# Patient Record
Sex: Female | Born: 1975 | Race: Black or African American | Hispanic: No | Marital: Married | Smoking: Never smoker
Health system: Southern US, Community
[De-identification: ages and names within clinical notes are randomized; demographics above are authoritative.]

## PROBLEM LIST (undated history)

## (undated) ENCOUNTER — Inpatient Hospital Stay (HOSPITAL_COMMUNITY): Payer: Self-pay

## (undated) DIAGNOSIS — E039 Hypothyroidism, unspecified: Secondary | ICD-10-CM

## (undated) DIAGNOSIS — E221 Hyperprolactinemia: Secondary | ICD-10-CM

## (undated) DIAGNOSIS — D649 Anemia, unspecified: Secondary | ICD-10-CM

## (undated) HISTORY — PX: DILATION AND CURETTAGE OF UTERUS: SHX78

## (undated) HISTORY — PX: OTHER SURGICAL HISTORY: SHX169

---

## 2013-07-02 ENCOUNTER — Other Ambulatory Visit: Payer: Self-pay | Admitting: Internal Medicine

## 2013-07-02 DIAGNOSIS — N926 Irregular menstruation, unspecified: Secondary | ICD-10-CM

## 2013-07-03 ENCOUNTER — Ambulatory Visit
Admission: RE | Admit: 2013-07-03 | Discharge: 2013-07-03 | Disposition: A | Discharge status: Home / Self Care | Payer: Managed Care, Other (non HMO) | Source: Ambulatory Visit | Attending: Internal Medicine | Admitting: Internal Medicine

## 2013-07-03 DIAGNOSIS — N926 Irregular menstruation, unspecified: Secondary | ICD-10-CM

## 2013-08-18 ENCOUNTER — Other Ambulatory Visit: Payer: Self-pay | Admitting: Internal Medicine

## 2013-08-18 DIAGNOSIS — E229 Hyperfunction of pituitary gland, unspecified: Principal | ICD-10-CM

## 2013-08-18 DIAGNOSIS — R7989 Other specified abnormal findings of blood chemistry: Secondary | ICD-10-CM

## 2013-08-29 ENCOUNTER — Ambulatory Visit (HOSPITAL_COMMUNITY)
Admission: RE | Admit: 2013-08-29 | Discharge: 2013-08-29 | Disposition: A | Discharge status: Home / Self Care | Payer: Managed Care, Other (non HMO) | Source: Ambulatory Visit | Attending: Internal Medicine | Admitting: Internal Medicine

## 2013-08-29 DIAGNOSIS — R7989 Other specified abnormal findings of blood chemistry: Secondary | ICD-10-CM

## 2013-08-29 DIAGNOSIS — D352 Benign neoplasm of pituitary gland: Secondary | ICD-10-CM | POA: Insufficient documentation

## 2013-08-29 DIAGNOSIS — E229 Hyperfunction of pituitary gland, unspecified: Secondary | ICD-10-CM | POA: Insufficient documentation

## 2013-08-29 DIAGNOSIS — D353 Benign neoplasm of craniopharyngeal duct: Secondary | ICD-10-CM

## 2013-08-29 MED ORDER — GADOBENATE DIMEGLUMINE 529 MG/ML IV SOLN
10.0000 mL | Freq: Once | INTRAVENOUS | Status: AC | PRN
  Administered 2013-08-29: 10 mL via INTRAVENOUS

## 2013-09-18 ENCOUNTER — Other Ambulatory Visit: Payer: Self-pay | Admitting: Obstetrics and Gynecology

## 2013-09-19 ENCOUNTER — Encounter (HOSPITAL_COMMUNITY): Payer: Self-pay | Admitting: Pharmacist

## 2013-09-23 ENCOUNTER — Other Ambulatory Visit (HOSPITAL_COMMUNITY): Payer: Self-pay | Admitting: Obstetrics and Gynecology

## 2013-09-23 NOTE — H&P (Signed)
Meghan Parsons is a 38 y.o. female G 0 for hysteroscopy, dilatation and curettage because of endometrial masses, menorrhagia, anemia and dysmenorrhea.  The patient was referred by Dr. 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  The ultrasound showed a retroverted uterus: 5.9 x 5.8  x 5.7 cm, endometrium measuring 16.6 mm with multiple masses-questionable polyps,  measuring: 10 mm, 22 mm and 13 mm;  multiple fibroids measuring: 11 mm,  13 mm and 3.9 cm; both ovaries and the left tube were normal though the right tube reflected a hydrosalpinx or loculated peritoneal fluid. The patient went on to report monthly menses that will last for 5 days during which time she passes large clots and changes her protection 3 times  a day.  With the passage of clots she will have severe cramping but does not take anything for it.  An endometrial biopsy performed in May showed fragments of benign polyps, no malignancy or atypia.  Lab results at that same time showed, normal TSH, CBC (except H/H = 10.2/32.6, and  elevated prolactin.  Patient had a CT of the brain that showed a 9 mm microadenoma for which she is being treated medically by an endocrinologist. She denies any urinary tract or bowel symptoms, vaginitis symptoms, intermenstrual bleeding or dyspareunia.  After reviewing the finding on ultrasound, endometrial biopsy and menstrual history the patient wanted to proceed with surgical management.   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 ferrous sulfate 325 (65 FE) MG tablet Take 325 mg by mouth at bedtime.  Cabegoline  0.5 mg daily  No Known Allergies  ROS:  Denies corrective lenses,  headache, vision changes, nasal congestion, dysphagia, tinnitus, dizziness, hoarseness, cough,  chest pain, shortness of breath, nausea, vomiting, diarrhea,constipation,  urinary frequency, urgency  dysuria, hematuria, vaginitis symptoms, pelvic pain, swelling of joints,easy bruising,  myalgias, arthralgias, skin rashes, unexplained weight loss and except as is mentioned in the history of present illness, patient's review of systems is otherwise negative.   Physical Exam  Bp: 104/60   P: 72  R: 16  Temperature:  98.6 degrees F orally   Weight: 238 lbs  Height: 5'8"   BMI: 36.2  Neck: supple without masses or thyromegaly Lungs: clear to auscultation Heart: regular rate and rhythm Abdomen: soft, non-tender and no organomegaly Pelvic:EGBUS- wnl; vagina-small blood uterus-normal size but irregular; cervix without tenderness or masses; adnexae without tenderness or masses Extremities:  no clubbing, cyanosis or edema   Assesment: Endometrial Polyps           Menorrhagia           Anemia           Dysmenorrhea              Disposition:  A discussion was held with patient regarding the indication for her procedure(s) along with the risks, which include but are not limited to: reaction to anesthesia, damage to adjacent organs, infection and excessive bleeding.  The patient verbalized understanding of these risks and has consented to proceed with Hysteroscopy, Dilatation and Curettage at [place] on September 24, 2013 at 2:30 p.m.   CSN# [id number]   Rebeckah Masih J. Florene Glen, PA-C  for Dr. Harvie Bridge. Mancel Bale

## 2013-09-24 ENCOUNTER — Ambulatory Visit (HOSPITAL_COMMUNITY)
Admission: RE | Admit: 2013-09-24 | Discharge: 2013-09-24 | Disposition: A | Discharge status: Home / Self Care | Payer: Managed Care, Other (non HMO) | Source: Ambulatory Visit | Attending: Obstetrics and Gynecology | Admitting: Obstetrics and Gynecology

## 2013-09-24 ENCOUNTER — Encounter (HOSPITAL_COMMUNITY)
Admission: RE | Disposition: A | Discharge status: Home / Self Care | Payer: Self-pay | Source: Ambulatory Visit | Attending: Obstetrics and Gynecology

## 2013-09-24 ENCOUNTER — Encounter (HOSPITAL_COMMUNITY): Payer: Managed Care, Other (non HMO) | Admitting: Anesthesiology

## 2013-09-24 ENCOUNTER — Ambulatory Visit (HOSPITAL_COMMUNITY): Payer: Managed Care, Other (non HMO) | Admitting: Anesthesiology

## 2013-09-24 ENCOUNTER — Encounter (HOSPITAL_COMMUNITY): Payer: Self-pay | Admitting: Anesthesiology

## 2013-09-24 DIAGNOSIS — N840 Polyp of corpus uteri: Secondary | ICD-10-CM | POA: Insufficient documentation

## 2013-09-24 DIAGNOSIS — D649 Anemia, unspecified: Secondary | ICD-10-CM | POA: Insufficient documentation

## 2013-09-24 DIAGNOSIS — N7013 Chronic salpingitis and oophoritis: Secondary | ICD-10-CM | POA: Insufficient documentation

## 2013-09-24 DIAGNOSIS — N920 Excessive and frequent menstruation with regular cycle: Secondary | ICD-10-CM | POA: Insufficient documentation

## 2013-09-24 DIAGNOSIS — N946 Dysmenorrhea, unspecified: Secondary | ICD-10-CM | POA: Insufficient documentation

## 2013-09-24 HISTORY — PX: DILATATION & CURRETTAGE/HYSTEROSCOPY WITH RESECTOCOPE: SHX5572

## 2013-09-24 LAB — CBC
HCT: 33.8 % — ABNORMAL LOW (ref 36.0–46.0)
Hemoglobin: 10.7 g/dL — ABNORMAL LOW (ref 12.0–15.0)
MCH: 23 pg — ABNORMAL LOW (ref 26.0–34.0)
MCHC: 31.7 g/dL (ref 30.0–36.0)
MCV: 72.7 fL — AB (ref 78.0–100.0)
PLATELETS: 249 10*3/uL (ref 150–400)
RBC: 4.65 MIL/uL (ref 3.87–5.11)
RDW: 14.4 % (ref 11.5–15.5)
WBC: 4.7 10*3/uL (ref 4.0–10.5)

## 2013-09-24 LAB — PREGNANCY, URINE: Preg Test, Ur: NEGATIVE

## 2013-09-24 SURGERY — DILATATION & CURETTAGE/HYSTEROSCOPY WITH RESECTOCOPE
Anesthesia: General | Site: Vagina

## 2013-09-24 MED ORDER — LIDOCAINE HCL (CARDIAC) 20 MG/ML IV SOLN
INTRAVENOUS | Status: DC | PRN
  Administered 2013-09-24: 100 mg via INTRAVENOUS

## 2013-09-24 MED ORDER — FENTANYL CITRATE 0.05 MG/ML IJ SOLN
25.0000 ug | INTRAMUSCULAR | Status: DC | PRN
  Administered 2013-09-24: 50 ug via INTRAVENOUS

## 2013-09-24 MED ORDER — LACTATED RINGERS IV SOLN
INTRAVENOUS | Status: DC
Start: 2013-09-24 — End: 2013-09-24
  Administered 2013-09-24 (×2): via INTRAVENOUS

## 2013-09-24 MED ORDER — GLYCINE 1.5 % IR SOLN
Status: DC | PRN
  Administered 2013-09-24: 3000 mL

## 2013-09-24 MED ORDER — LIDOCAINE HCL 1 % IJ SOLN
INTRAMUSCULAR | Status: DC | PRN
Start: 2013-09-24 — End: 2013-09-24
  Administered 2013-09-24: 10 mL

## 2013-09-24 MED ORDER — DEXAMETHASONE SODIUM PHOSPHATE 10 MG/ML IJ SOLN
INTRAMUSCULAR | Status: DC | PRN
  Administered 2013-09-24: 10 mg via INTRAVENOUS

## 2013-09-24 MED ORDER — KETOROLAC TROMETHAMINE 30 MG/ML IJ SOLN
INTRAMUSCULAR | Status: AC
  Filled 2013-09-24: qty 1

## 2013-09-24 MED ORDER — FENTANYL CITRATE 0.05 MG/ML IJ SOLN
INTRAMUSCULAR | Status: AC
  Filled 2013-09-24: qty 2

## 2013-09-24 MED ORDER — KETOROLAC TROMETHAMINE 30 MG/ML IJ SOLN
INTRAMUSCULAR | Status: DC | PRN
  Administered 2013-09-24: 30 mg via INTRAVENOUS

## 2013-09-24 MED ORDER — LIDOCAINE HCL 1 % IJ SOLN
INTRAMUSCULAR | Status: AC
  Filled 2013-09-24: qty 20

## 2013-09-24 MED ORDER — ONDANSETRON HCL 4 MG/2ML IJ SOLN
INTRAMUSCULAR | Status: AC
  Filled 2013-09-24: qty 2

## 2013-09-24 MED ORDER — MIDAZOLAM HCL 2 MG/2ML IJ SOLN
INTRAMUSCULAR | Status: DC | PRN
  Administered 2013-09-24: 2 mg via INTRAVENOUS

## 2013-09-24 MED ORDER — OXYCODONE-ACETAMINOPHEN 5-325 MG PO TABS: 1.0000 | ORAL_TABLET | Freq: Four times a day (QID) | ORAL | Status: DC | PRN

## 2013-09-24 MED ORDER — PROPOFOL 10 MG/ML IV EMUL
INTRAVENOUS | Status: AC
  Filled 2013-09-24: qty 20

## 2013-09-24 MED ORDER — MEPERIDINE HCL 25 MG/ML IJ SOLN: 6.2500 mg | INTRAMUSCULAR | Status: DC | PRN

## 2013-09-24 MED ORDER — FENTANYL CITRATE 0.05 MG/ML IJ SOLN
INTRAMUSCULAR | Status: DC | PRN
Start: 2013-09-24 — End: 2013-09-24
  Administered 2013-09-24: 100 ug via INTRAVENOUS

## 2013-09-24 MED ORDER — METOCLOPRAMIDE HCL 5 MG/ML IJ SOLN: 10.0000 mg | Freq: Once | INTRAMUSCULAR | Status: DC | PRN

## 2013-09-24 MED ORDER — FENTANYL CITRATE 0.05 MG/ML IJ SOLN
INTRAMUSCULAR | Status: AC
  Administered 2013-09-24: 50 ug via INTRAVENOUS
  Filled 2013-09-24: qty 2

## 2013-09-24 MED ORDER — ONDANSETRON HCL 4 MG/2ML IJ SOLN
INTRAMUSCULAR | Status: DC | PRN
  Administered 2013-09-24: 4 mg via INTRAVENOUS

## 2013-09-24 MED ORDER — IBUPROFEN 600 MG PO TABS: 600.0000 mg | ORAL_TABLET | Freq: Four times a day (QID) | ORAL | Status: DC | PRN

## 2013-09-24 MED ORDER — MIDAZOLAM HCL 2 MG/2ML IJ SOLN
INTRAMUSCULAR | Status: AC
  Filled 2013-09-24: qty 2

## 2013-09-24 MED ORDER — PROPOFOL 10 MG/ML IV BOLUS
INTRAVENOUS | Status: DC | PRN
  Administered 2013-09-24: 200 mg via INTRAVENOUS

## 2013-09-24 MED ORDER — DEXAMETHASONE SODIUM PHOSPHATE 10 MG/ML IJ SOLN
INTRAMUSCULAR | Status: AC
  Filled 2013-09-24: qty 1

## 2013-09-24 MED ORDER — LIDOCAINE HCL (CARDIAC) 20 MG/ML IV SOLN
INTRAVENOUS | Status: AC
  Filled 2013-09-24: qty 5

## 2013-09-24 SURGICAL SUPPLY — 19 items
CANISTER SUCT 3000ML (MISCELLANEOUS) ×3 IMPLANT
CATH ROBINSON RED A/P 16FR (CATHETERS) ×3 IMPLANT
CLOTH BEACON ORANGE TIMEOUT ST (SAFETY) ×3 IMPLANT
CONTAINER PREFILL 10% NBF 60ML (FORM) ×6 IMPLANT
DRAPE HYSTEROSCOPY (DRAPE) ×3 IMPLANT
DRSG TELFA 3X8 NADH (GAUZE/BANDAGES/DRESSINGS) ×3 IMPLANT
ELECT REM PT RETURN 9FT ADLT (ELECTROSURGICAL) ×3
ELECTRODE REM PT RTRN 9FT ADLT (ELECTROSURGICAL) ×1 IMPLANT
GLOVE BIO SURGEON STRL SZ7.5 (GLOVE) ×3 IMPLANT
GLOVE BIOGEL PI IND STRL 7.5 (GLOVE) ×1 IMPLANT
GLOVE BIOGEL PI INDICATOR 7.5 (GLOVE) ×2
GOWN STRL REUS W/TWL LRG LVL3 (GOWN DISPOSABLE) ×6 IMPLANT
LOOP ANGLED CUTTING 22FR (CUTTING LOOP) IMPLANT
PACK VAGINAL MINOR WOMEN LF (CUSTOM PROCEDURE TRAY) ×3 IMPLANT
PAD OB MATERNITY 4.3X12.25 (PERSONAL CARE ITEMS) ×3 IMPLANT
SET TUBING HYSTEROSCOPY 2 NDL (TUBING) IMPLANT
TOWEL OR 17X24 6PK STRL BLUE (TOWEL DISPOSABLE) ×6 IMPLANT
TUBE HYSTEROSCOPY W Y-CONNECT (TUBING) IMPLANT
WATER STERILE IRR 1000ML POUR (IV SOLUTION) ×3 IMPLANT

## 2013-09-24 NOTE — H&P (View-Only) (Signed)
Meghan Parsons is a 38 y.o. female G 0 for hysteroscopy, dilatation and curettage because of endometrial masses, menorrhagia, anemia and dysmenorrhea.  The patient was referred by Dr. 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  The ultrasound showed a retroverted uterus: 5.9 x 5.8  x 5.7 cm, endometrium measuring 16.6 mm with multiple masses-questionable polyps,  measuring: 10 mm, 22 mm and 13 mm;  multiple fibroids measuring: 11 mm,  13 mm and 3.9 cm; both ovaries and the left tube were normal though the right tube reflected a hydrosalpinx or loculated peritoneal fluid. The patient went on to report monthly menses that will last for 5 days during which time she passes large clots and changes her protection 3 times  a day.  With the passage of clots she will have severe cramping but does not take anything for it.  An endometrial biopsy performed in May showed fragments of benign polyps, no malignancy or atypia.  Lab results at that same time showed, normal TSH, CBC (except H/H = 10.2/32.6, and  elevated prolactin.  Patient had a CT of the brain that showed a 9 mm microadenoma for which she is being treated medically by an endocrinologist. She denies any urinary tract or bowel symptoms, vaginitis symptoms, intermenstrual bleeding or dyspareunia.  After reviewing the finding on ultrasound, endometrial biopsy and menstrual history the patient wanted to proceed with surgical management.   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 ferrous sulfate 325 (65 FE) MG tablet Take 325 mg by mouth at bedtime.  Cabegoline  0.5 mg daily  No Known Allergies  ROS:  Denies corrective lenses,  headache, vision changes, nasal congestion, dysphagia, tinnitus, dizziness, hoarseness, cough,  chest pain, shortness of breath, nausea, vomiting, diarrhea,constipation,  urinary frequency, urgency  dysuria, hematuria, vaginitis symptoms, pelvic pain, swelling of joints,easy bruising,  myalgias, arthralgias, skin rashes, unexplained weight loss and except as is mentioned in the history of present illness, patient's review of systems is otherwise negative.   Physical Exam  Bp: 104/60   P: 72  R: 16  Temperature:  98.6 degrees F orally   Weight: 238 lbs  Height: 5'8"   BMI: 36.2  Neck: supple without masses or thyromegaly Lungs: clear to auscultation Heart: regular rate and rhythm Abdomen: soft, non-tender and no organomegaly Pelvic:EGBUS- wnl; vagina-small blood uterus-normal size but irregular; cervix without tenderness or masses; adnexae without tenderness or masses Extremities:  no clubbing, cyanosis or edema   Assesment: Endometrial Polyps           Menorrhagia           Anemia           Dysmenorrhea              Disposition:  A discussion was held with patient regarding the indication for her procedure(s) along with the risks, which include but are not limited to: reaction to anesthesia, damage to adjacent organs, infection and excessive bleeding.  The patient verbalized understanding of these risks and has consented to proceed with Hysteroscopy, Dilatation and Curettage at [facility] on September 24, 2013 at 2:30 p.m.   CSN# [id number]   Kensington Duerst J. Florene Glen, PA-C  for Dr. Harvie Bridge. Mancel Bale

## 2013-09-24 NOTE — Discharge Instructions (Signed)

## 2013-09-24 NOTE — Op Note (Signed)
Preop Diagnosis: 1.Menorrhagia 2.Polyp  Postop Diagnosis: 1.Menorrhagia 2.Polyp  Procedure: DILATION & CURETTAGE/HYSTEROSCOPY   Anesthesia: Choice   Anesthesiologist: Lyn Hollingshead, MD   Attending: Delice Lesch, MD   Assistant: N/a  Findings: Polyp on posterior/left side wall of uterus  Pathology: Endometrial Curettings  Fluids: 1000 cc  UOP: 30 cc via straight cath  EBL: Minimal  Complications: None  Procedure: The patient was taken to the operating room after the risks, benefits and alternatives were discussed with the patient. The patient verbalized understanding and consent signed and witnessed. The patient was placed under general anesthesia with an LMA per anesthesiologist and prepped and draped in the normal sterile fashion.  Time Out was performed per protocol.  A bivalve speculum was placed in the patient's vagina and the anterior lip of the cervix was grasped with a single tooth tenaculum.  A paracervical block was administered using a total of 10 cc of 1% lidocaine. The uterus sounded to 9 cm. The cervix was dilated for passage of the hysteroscope.  The hysteroscope was introduced into the uterine cavity and findings as noted above. Sharp curettage was performed until a gritty texture was noted and curettings were sent to pathology.  A polyp forcep was placed through the port on the hysteroscope and remainder of polyp removed.  The hysteroscope was reintroduced and no obvious remaining intracavitary lesions were noted.  All instruments were removed. Sponge lap and needle count was correct. The patient tolerated the procedure well and was returned to the recovery room in good condition.

## 2013-09-24 NOTE — Anesthesia Postprocedure Evaluation (Signed)
Anesthesia Post Note  Patient: Meghan Parsons  Procedure(s) Performed: Procedure(s) (LRB): DILATATION & CURETTAGE/HYSTEROSCOPY WITH RESECTOCOPE (N/A)  Anesthesia type: General  Patient location: PACU  Post pain: Pain level controlled  Post assessment: Post-op Vital signs reviewed  Last Vitals:  Filed Vitals:   09/24/13 1615  BP: 121/73  Pulse: 72  Temp:   Resp: 16    Post vital signs: Reviewed  Level of consciousness: sedated  Complications: No apparent anesthesia complications

## 2013-09-24 NOTE — Anesthesia Preprocedure Evaluation (Signed)
Anesthesia Evaluation  Patient identified by MRN, date of birth, ID band Patient awake    Reviewed: Allergy & Precautions, H&P , NPO status , Patient's Chart, lab work & pertinent test results  Airway Mallampati: III TM Distance: >3 FB Neck ROM: Full    Dental no notable dental hx. (+) Teeth Intact   Pulmonary neg pulmonary ROS,  breath sounds clear to auscultation  Pulmonary exam normal       Cardiovascular negative cardio ROS  Rhythm:Regular Rate:Normal     Neuro/Psych negative neurological ROS  negative psych ROS   GI/Hepatic negative GI ROS, Neg liver ROS,   Endo/Other  negative endocrine ROS  Renal/GU negative Renal ROS  negative genitourinary   Musculoskeletal negative musculoskeletal ROS (+)   Abdominal (+) + obese,   Peds  Hematology negative hematology ROS (+)   Anesthesia Other Findings   Reproductive/Obstetrics Endometrial Polyp Menorrhagia dysmenorrhea                           Anesthesia Physical Anesthesia Plan  ASA: II  Anesthesia Plan: General   Post-op Pain Management:    Induction: Intravenous  Airway Management Planned: LMA  Additional Equipment:   Intra-op Plan:   Post-operative Plan: Extubation in OR  Informed Consent: I have reviewed the patients History and Physical, chart, labs and discussed the procedure including the risks, benefits and alternatives for the proposed anesthesia with the patient or authorized representative who has indicated his/her understanding and acceptance.   Dental advisory given  Plan Discussed with: CRNA, Anesthesiologist and Surgeon  Anesthesia Plan Comments:         Anesthesia Quick Evaluation

## 2013-09-24 NOTE — Interval H&P Note (Signed)
History and Physical Interval Note:  09/24/2013 2:37 PM  Meghan Parsons  has presented today for surgery, with the diagnosis of Menorrhagia  The various methods of treatment have been discussed with the patient and family. After consideration of risks, benefits and other options for treatment, the patient has consented to  Procedure(s): [place] (N/A) as a surgical intervention .  The patient's history has been reviewed, patient examined, no change in status, stable for surgery.  I have reviewed the patient's chart and labs.  Questions were answered to the patient's satisfaction.     Delice Lesch

## 2013-09-24 NOTE — Anesthesia Procedure Notes (Signed)
Procedure Name: LMA Insertion Date/Time: 09/24/2013 3:05 PM Performed by: Sabra Heck L Pre-anesthesia Checklist: Patient identified, Patient being monitored, Emergency Drugs available, Timeout performed and Suction available Patient Re-evaluated:Patient Re-evaluated prior to inductionOxygen Delivery Method: Circle system utilized Preoxygenation: Pre-oxygenation with 100% oxygen Intubation Type: IV induction Ventilation: Mask ventilation without difficulty LMA: LMA inserted LMA Size: 4.0 Number of attempts: 1 Placement Confirmation: positive ETCO2 and breath sounds checked- equal and bilateral ETT to lip (cm): at lips. Dental Injury: Teeth and Oropharynx as per pre-operative assessment

## 2013-09-24 NOTE — Transfer of Care (Signed)
Immediate Anesthesia Transfer of Care Note  Patient: Meghan Parsons  Procedure(s) Performed: Procedure(s): DILATATION & CURETTAGE/HYSTEROSCOPY WITH RESECTOCOPE (N/A)  Patient Location: PACU  Anesthesia Type:General  Level of Consciousness: awake, alert  and oriented  Airway & Oxygen Therapy: Patient Spontanous Breathing and Patient connected to nasal cannula oxygen  Post-op Assessment: Report given to PACU RN and Post -op Vital signs reviewed and stable  Post vital signs: Reviewed and stable  Complications: No apparent anesthesia complications

## 2013-09-25 ENCOUNTER — Encounter (HOSPITAL_COMMUNITY): Payer: Self-pay | Admitting: Obstetrics and Gynecology

## 2015-04-16 LAB — OB RESULTS CONSOLE HEPATITIS B SURFACE ANTIGEN: HEP B S AG: NEGATIVE

## 2015-04-16 LAB — OB RESULTS CONSOLE ANTIBODY SCREEN: Antibody Screen: NEGATIVE

## 2015-04-16 LAB — OB RESULTS CONSOLE HIV ANTIBODY (ROUTINE TESTING): HIV: NONREACTIVE

## 2015-04-16 LAB — OB RESULTS CONSOLE RUBELLA ANTIBODY, IGM: RUBELLA: IMMUNE

## 2015-04-16 LAB — OB RESULTS CONSOLE GC/CHLAMYDIA
CHLAMYDIA, DNA PROBE: NEGATIVE
Gonorrhea: NEGATIVE

## 2015-04-16 LAB — OB RESULTS CONSOLE ABO/RH: RH Type: POSITIVE

## 2015-04-16 LAB — OB RESULTS CONSOLE RPR: RPR: NONREACTIVE

## 2015-07-14 ENCOUNTER — Inpatient Hospital Stay (HOSPITAL_COMMUNITY): Payer: Medicaid Other

## 2015-07-14 ENCOUNTER — Other Ambulatory Visit: Payer: Self-pay | Admitting: Obstetrics and Gynecology

## 2015-07-14 ENCOUNTER — Inpatient Hospital Stay (HOSPITAL_COMMUNITY)
Admission: AD | Admit: 2015-07-14 | Discharge: 2015-07-14 | Disposition: A | Discharge status: Home / Self Care | Payer: Medicaid Other | Source: Ambulatory Visit | Attending: Obstetrics & Gynecology | Admitting: Obstetrics & Gynecology

## 2015-07-14 ENCOUNTER — Encounter (HOSPITAL_COMMUNITY): Payer: Self-pay | Admitting: *Deleted

## 2015-07-14 DIAGNOSIS — Z79899 Other long term (current) drug therapy: Secondary | ICD-10-CM | POA: Diagnosis not present

## 2015-07-14 DIAGNOSIS — Z3A23 23 weeks gestation of pregnancy: Secondary | ICD-10-CM | POA: Insufficient documentation

## 2015-07-14 DIAGNOSIS — O2692 Pregnancy related conditions, unspecified, second trimester: Secondary | ICD-10-CM | POA: Insufficient documentation

## 2015-07-14 DIAGNOSIS — R5383 Other fatigue: Secondary | ICD-10-CM | POA: Diagnosis not present

## 2015-07-14 DIAGNOSIS — R109 Unspecified abdominal pain: Secondary | ICD-10-CM | POA: Insufficient documentation

## 2015-07-14 DIAGNOSIS — O368390 Maternal care for abnormalities of the fetal heart rate or rhythm, unspecified trimester, not applicable or unspecified: Secondary | ICD-10-CM

## 2015-07-14 LAB — URINALYSIS, ROUTINE W REFLEX MICROSCOPIC
Bilirubin Urine: NEGATIVE
Glucose, UA: NEGATIVE mg/dL
Hgb urine dipstick: NEGATIVE
Ketones, ur: NEGATIVE mg/dL
LEUKOCYTES UA: NEGATIVE
Nitrite: NEGATIVE
PH: 5.5 (ref 5.0–8.0)
Protein, ur: NEGATIVE mg/dL
Specific Gravity, Urine: <1.005 — ABNORMAL LOW (ref 1.005–1.030)

## 2015-07-14 LAB — CBC WITH DIFFERENTIAL/PLATELET
BASOS ABS: 0 10*3/uL (ref 0.0–0.1)
Basophils Relative: 0 %
EOS ABS: 0.1 10*3/uL (ref 0.0–0.7)
Eosinophils Relative: 0 %
HCT: 27.3 % — ABNORMAL LOW (ref 36.0–46.0)
HEMOGLOBIN: 8.7 g/dL — AB (ref 12.0–15.0)
Lymphocytes Relative: 14 %
Lymphs Abs: 2.3 10*3/uL (ref 0.7–4.0)
MCH: 22.3 pg — AB (ref 26.0–34.0)
MCHC: 31.9 g/dL (ref 30.0–36.0)
MCV: 70 fL — ABNORMAL LOW (ref 78.0–100.0)
Monocytes Absolute: 0.8 10*3/uL (ref 0.1–1.0)
Monocytes Relative: 5 %
NEUTROS PCT: 81 %
Neutro Abs: 13.9 10*3/uL — ABNORMAL HIGH (ref 1.7–7.7)
PLATELETS: 214 10*3/uL (ref 150–400)
RBC: 3.9 MIL/uL (ref 3.87–5.11)
RDW: 14.6 % (ref 11.5–15.5)
WBC: 17.1 10*3/uL — AB (ref 4.0–10.5)

## 2015-07-14 LAB — AMNISURE RUPTURE OF MEMBRANE (ROM) NOT AT ARMC: Amnisure ROM: NEGATIVE

## 2015-07-14 LAB — INFLUENZA PANEL BY PCR (TYPE A & B)
H1N1 flu by pcr: NOT DETECTED
INFLBPCR: NEGATIVE
Influenza A By PCR: NEGATIVE

## 2015-07-14 LAB — COMPREHENSIVE METABOLIC PANEL
ALK PHOS: 73 U/L (ref 38–126)
ALT: 18 U/L (ref 14–54)
AST: 24 U/L (ref 15–41)
Albumin: 2.8 g/dL — ABNORMAL LOW (ref 3.5–5.0)
Anion gap: 5 (ref 5–15)
BUN: 6 mg/dL (ref 6–20)
CALCIUM: 8.7 mg/dL — AB (ref 8.9–10.3)
CHLORIDE: 107 mmol/L (ref 101–111)
CO2: 23 mmol/L (ref 22–32)
Creatinine, Ser: 0.48 mg/dL (ref 0.44–1.00)
GFR calc non Af Amer: >60 mL/min (ref >60)
Glucose, Bld: 95 mg/dL (ref 65–99)
Potassium: 3.4 mmol/L — ABNORMAL LOW (ref 3.5–5.1)
SODIUM: 135 mmol/L (ref 135–145)
Total Bilirubin: 0.4 mg/dL (ref 0.3–1.2)
Total Protein: 6.1 g/dL — ABNORMAL LOW (ref 6.5–8.1)

## 2015-07-14 MED ORDER — LACTATED RINGERS IV BOLUS (SEPSIS)
1000.0000 mL | Freq: Once | INTRAVENOUS | Status: AC
  Administered 2015-07-14: 1000 mL via INTRAVENOUS

## 2015-07-14 MED ORDER — ACETAMINOPHEN 325 MG PO TABS
650.0000 mg | ORAL_TABLET | Freq: Once | ORAL | Status: AC
  Administered 2015-07-14: 650 mg via ORAL
  Filled 2015-07-14: qty 2

## 2015-07-14 NOTE — MAU Provider Note (Signed)
Meghan Parsons is a 40 y.o. G1P0 at 23.6 weeks c/o body ache, fever of 104.0 and chills. Orders put in by Donnel Saxon CNM   History     There are no active problems to display for this patient.   Chief Complaint  Patient presents with  . Abdominal Pain  . Generalized Body Aches  . Fatigue   HPI  OB History    Gravida Para Term Preterm AB TAB SAB Ectopic Multiple Living   1               Past Medical History  Diagnosis Date  . Medical history non-contributory     Past Surgical History  Procedure Laterality Date  . Dilatation & currettage/hysteroscopy with resectocope N/A 09/24/2013    Procedure: [place];  Surgeon: Delice Lesch, MD;  Location: [facility];  Service: Gynecology;  Laterality: N/A;    History reviewed. No pertinent family history.  Social History  Substance Use Topics  . Smoking status: Never Smoker   . Smokeless tobacco: Never Used  . Alcohol Use: No    Allergies: No Known Allergies  Prescriptions prior to admission  Medication Sig Dispense Refill Last Dose  . acetaminophen (TYLENOL) 500 MG tablet Take 1,000 mg by mouth every 6 (six) hours as needed for mild pain or moderate pain.   07/14/2015 at Unknown time  . ferrous sulfate 325 (65 FE) MG tablet Take 325 mg by mouth at bedtime.   07/12/2015  . ibuprofen (ADVIL,MOTRIN) 600 MG tablet Take 1 tablet (600 mg total) by mouth every 6 (six) hours as needed. 30 tablet 1 07/12/2015  . oxyCODONE-acetaminophen (PERCOCET) 5-325 MG per tablet Take 1-2 tablets by mouth every 6 (six) hours as needed for severe pain. (Patient not taking: Reported on 07/14/2015) 30 tablet 0     ROS See HPI above, all other systems are negative  Physical Exam   Blood pressure 115/65, pulse 93, temperature 97.7 F (36.5 C), temperature source Oral, resp. rate 18.  Physical Exam Ext:  WNL ABD: Soft, non tender to palpation, no rebound or guarding SVE: deferred   ED Course   Assessment: IUP 23.6weeks Membranes: intact FHR: 150, min-mod variability, + accel, variable decels CTX:  none Afebrile   Plan: Per VL, CBC, CMP, UA, urine culture, Flu swab, droplet precautions [place]  Venus Standard, CNM, MSN 07/14/2015. 3:33 PM   Addendum [place] report [place] - single pocket of fluid 2x2 and a large uterine myoma 5.7x6.5x4.6 FHR 141, cervical length 3.5,   Amnisure Dr Alesia Richards strip reviewed    MAU Addendum Note, 1427  Results for orders placed or performed during the hospital encounter of 07/14/15 (from the past 24 hour(s))  Urinalysis, Routine w reflex microscopic (not at [facility])     Status: Abnormal   Collection Time: 07/14/15 11:03 AM  Result Value Ref Range   Color, Urine YELLOW YELLOW   APPearance CLEAR CLEAR   Specific Gravity, Urine <1.005 (L) 1.005 - 1.030   pH 5.5 5.0 - 8.0   Glucose, UA NEGATIVE NEGATIVE mg/dL   Hgb urine dipstick NEGATIVE NEGATIVE   Bilirubin Urine NEGATIVE NEGATIVE   Ketones, ur NEGATIVE NEGATIVE mg/dL   Protein, ur NEGATIVE NEGATIVE mg/dL   Nitrite NEGATIVE NEGATIVE   Leukocytes, UA NEGATIVE NEGATIVE  CBC with Differential/Platelet     Status: Abnormal   Collection Time: 07/14/15 11:19 AM  Result Value Ref Range   WBC 17.1 (H) 4.0 - 10.5 K/uL   RBC  3.90 3.87 - 5.11 MIL/uL   Hemoglobin 8.7 (L) 12.0 - 15.0 g/dL   HCT 27.3 (L) 36.0 - 46.0 %   MCV 70.0 (L) 78.0 - 100.0 fL   MCH 22.3 (L) 26.0 - 34.0 pg   MCHC 31.9 30.0 - 36.0 g/dL   RDW 14.6 11.5 - 15.5 %   Platelets 214 150 - 400 K/uL   Neutrophils Relative % 81 %   Neutro Abs 13.9 (H) 1.7 - 7.7 K/uL   Lymphocytes Relative 14 %   Lymphs Abs 2.3 0.7 - 4.0 K/uL   Monocytes Relative 5 %   Monocytes Absolute 0.8 0.1 - 1.0 K/uL   Eosinophils Relative 0 %   Eosinophils Absolute 0.1 0.0 - 0.7 K/uL   Basophils Relative 0 %   Basophils Absolute 0.0 0.0 - 0.1 K/uL  Comprehensive metabolic panel     Status: Abnormal   Collection Time: 07/14/15 11:19 AM  Result Value Ref Range    Sodium 135 135 - 145 mmol/L   Potassium 3.4 (L) 3.5 - 5.1 mmol/L   Chloride 107 101 - 111 mmol/L   CO2 23 22 - 32 mmol/L   Glucose, Bld 95 65 - 99 mg/dL   BUN 6 6 - 20 mg/dL   Creatinine, Ser 0.48 0.44 - 1.00 mg/dL   Calcium 8.7 (L) 8.9 - 10.3 mg/dL   Total Protein 6.1 (L) 6.5 - 8.1 g/dL   Albumin 2.8 (L) 3.5 - 5.0 g/dL   AST 24 15 - 41 U/L   ALT 18 14 - 54 U/L   Alkaline Phosphatase 73 38 - 126 U/L   Total Bilirubin 0.4 0.3 - 1.2 mg/dL   GFR calc non Af Amer >60 >60 mL/min   GFR calc Af Amer >60 >60 mL/min   Anion gap 5 5 - 15  Influenza panel by PCR (type A & B, H1N1)     Status: None   Collection Time: 07/14/15 11:29 AM  Result Value Ref Range   Influenza A By PCR NEGATIVE NEGATIVE   Influenza B By PCR NEGATIVE NEGATIVE   H1N1 flu by pcr NOT DETECTED NOT DETECTED  Amnisure rupture of membrane (rom)not at [facility]     Status: None   Collection Time: 07/14/15  3:10 PM  Result Value Ref Range   Amnisure ROM NEGATIVE      Plan: -Discussed need to follow up in office  -Bleeding and PTL Precautions -Encouraged to call if any questions or concerns arise prior to next scheduled office visit.  -Per Dr Alesia Richards discharged to home in stable condition -Tylenol for pain or bodyache -pt will be notified of a positive urine culture    Venus Standard, CNM, MSN 07/14/2015. 3:33 PM  Patient remained afebrile during presentation.  Fetal heart tracing was normal before discharge.  She was advised to follow up in office within 1 week for a repeat ultrasound for fluid check and symptoms follow up.  Dr. Alesia Richards.

## 2015-07-14 NOTE — Discharge Instructions (Signed)

## 2015-07-14 NOTE — MAU Note (Signed)
Pt C/o body aches, fatigue, cold x 2 weeks.  Also upper abd pain for the past 2 days, denies bleeding or discharge.  Denies vomiting, diarrhea, cough, or sore throat.

## 2015-07-15 ENCOUNTER — Encounter (HOSPITAL_COMMUNITY): Payer: Self-pay

## 2015-07-15 ENCOUNTER — Inpatient Hospital Stay (HOSPITAL_COMMUNITY)
Admission: AD | Admit: 2015-07-15 | Discharge: 2015-07-17 | DRG: 781 | Disposition: A | Discharge status: Other Acute Inpatient Hospital | Payer: Medicaid Other | Source: Ambulatory Visit | Attending: Obstetrics & Gynecology | Admitting: Obstetrics & Gynecology

## 2015-07-15 DIAGNOSIS — O4592 Premature separation of placenta, unspecified, second trimester: Principal | ICD-10-CM | POA: Diagnosis present

## 2015-07-15 DIAGNOSIS — D649 Anemia, unspecified: Secondary | ICD-10-CM | POA: Diagnosis present

## 2015-07-15 DIAGNOSIS — M7918 Myalgia, other site: Secondary | ICD-10-CM

## 2015-07-15 DIAGNOSIS — O3412 Maternal care for benign tumor of corpus uteri, second trimester: Secondary | ICD-10-CM | POA: Diagnosis present

## 2015-07-15 DIAGNOSIS — O99282 Endocrine, nutritional and metabolic diseases complicating pregnancy, second trimester: Secondary | ICD-10-CM | POA: Diagnosis present

## 2015-07-15 DIAGNOSIS — R509 Fever, unspecified: Secondary | ICD-10-CM | POA: Diagnosis present

## 2015-07-15 DIAGNOSIS — R0682 Tachypnea, not elsewhere classified: Secondary | ICD-10-CM

## 2015-07-15 DIAGNOSIS — O99012 Anemia complicating pregnancy, second trimester: Secondary | ICD-10-CM | POA: Diagnosis present

## 2015-07-15 DIAGNOSIS — O4690 Antepartum hemorrhage, unspecified, unspecified trimester: Secondary | ICD-10-CM | POA: Diagnosis present

## 2015-07-15 DIAGNOSIS — D259 Leiomyoma of uterus, unspecified: Secondary | ICD-10-CM | POA: Diagnosis present

## 2015-07-15 DIAGNOSIS — E039 Hypothyroidism, unspecified: Secondary | ICD-10-CM | POA: Diagnosis present

## 2015-07-15 DIAGNOSIS — Z3A24 24 weeks gestation of pregnancy: Secondary | ICD-10-CM

## 2015-07-15 DIAGNOSIS — O09512 Supervision of elderly primigravida, second trimester: Secondary | ICD-10-CM

## 2015-07-15 DIAGNOSIS — O4102X0 Oligohydramnios, second trimester, not applicable or unspecified: Secondary | ICD-10-CM | POA: Diagnosis present

## 2015-07-15 HISTORY — DX: Anemia, unspecified: D64.9

## 2015-07-15 HISTORY — DX: Hyperprolactinemia: E22.1

## 2015-07-15 HISTORY — DX: Hypothyroidism, unspecified: E03.9

## 2015-07-15 NOTE — MAU Note (Addendum)
Patient presents with c/o of pain in her tail bone that started yesterday and has got progressively worse. Patient also has vaginal bleeding that started an hour ago.

## 2015-07-15 NOTE — MAU Note (Signed)
Urine sent to lab 

## 2015-07-16 ENCOUNTER — Inpatient Hospital Stay (HOSPITAL_COMMUNITY): Payer: Medicaid Other

## 2015-07-16 ENCOUNTER — Encounter (HOSPITAL_COMMUNITY): Payer: Self-pay

## 2015-07-16 ENCOUNTER — Ambulatory Visit (HOSPITAL_COMMUNITY)
Admit: 2015-07-16 | Discharge: 2015-07-16 | Disposition: A | Discharge status: Home / Self Care | Payer: Medicaid Other | Attending: Certified Nurse Midwife | Admitting: Certified Nurse Midwife

## 2015-07-16 DIAGNOSIS — R509 Fever, unspecified: Secondary | ICD-10-CM | POA: Diagnosis present

## 2015-07-16 DIAGNOSIS — O09512 Supervision of elderly primigravida, second trimester: Secondary | ICD-10-CM | POA: Diagnosis not present

## 2015-07-16 DIAGNOSIS — O4690 Antepartum hemorrhage, unspecified, unspecified trimester: Secondary | ICD-10-CM | POA: Diagnosis present

## 2015-07-16 DIAGNOSIS — O4592 Premature separation of placenta, unspecified, second trimester: Secondary | ICD-10-CM | POA: Diagnosis present

## 2015-07-16 DIAGNOSIS — D649 Anemia, unspecified: Secondary | ICD-10-CM | POA: Diagnosis present

## 2015-07-16 DIAGNOSIS — D259 Leiomyoma of uterus, unspecified: Secondary | ICD-10-CM | POA: Diagnosis present

## 2015-07-16 DIAGNOSIS — M25512 Pain in left shoulder: Secondary | ICD-10-CM | POA: Diagnosis not present

## 2015-07-16 DIAGNOSIS — O99282 Endocrine, nutritional and metabolic diseases complicating pregnancy, second trimester: Secondary | ICD-10-CM | POA: Diagnosis present

## 2015-07-16 DIAGNOSIS — Z3A24 24 weeks gestation of pregnancy: Secondary | ICD-10-CM

## 2015-07-16 DIAGNOSIS — O3412 Maternal care for benign tumor of corpus uteri, second trimester: Secondary | ICD-10-CM | POA: Diagnosis present

## 2015-07-16 DIAGNOSIS — O4692 Antepartum hemorrhage, unspecified, second trimester: Secondary | ICD-10-CM | POA: Diagnosis not present

## 2015-07-16 DIAGNOSIS — K6289 Other specified diseases of anus and rectum: Secondary | ICD-10-CM

## 2015-07-16 DIAGNOSIS — O99012 Anemia complicating pregnancy, second trimester: Secondary | ICD-10-CM | POA: Diagnosis present

## 2015-07-16 DIAGNOSIS — E039 Hypothyroidism, unspecified: Secondary | ICD-10-CM | POA: Diagnosis present

## 2015-07-16 DIAGNOSIS — M533 Sacrococcygeal disorders, not elsewhere classified: Secondary | ICD-10-CM | POA: Diagnosis not present

## 2015-07-16 DIAGNOSIS — D72829 Elevated white blood cell count, unspecified: Secondary | ICD-10-CM | POA: Diagnosis not present

## 2015-07-16 DIAGNOSIS — O468X2 Other antepartum hemorrhage, second trimester: Secondary | ICD-10-CM

## 2015-07-16 DIAGNOSIS — O4102X0 Oligohydramnios, second trimester, not applicable or unspecified: Secondary | ICD-10-CM | POA: Diagnosis present

## 2015-07-16 DIAGNOSIS — O418X20 Other specified disorders of amniotic fluid and membranes, second trimester, not applicable or unspecified: Secondary | ICD-10-CM

## 2015-07-16 LAB — CBC WITH DIFFERENTIAL/PLATELET
BAND NEUTROPHILS: 0 %
BASOS ABS: 0 10*3/uL (ref 0.0–0.1)
BASOS ABS: 0 10*3/uL (ref 0.0–0.1)
BLASTS: 0 %
Basophils Relative: 0 %
Basophils Relative: 0 %
EOS ABS: 0 10*3/uL (ref 0.0–0.7)
EOS ABS: 0 10*3/uL (ref 0.0–0.7)
Eosinophils Relative: 0 %
Eosinophils Relative: 0 %
HCT: 26 % — ABNORMAL LOW (ref 36.0–46.0)
HEMATOCRIT: 27.2 % — AB (ref 36.0–46.0)
HEMOGLOBIN: 8.8 g/dL — AB (ref 12.0–15.0)
Hemoglobin: 8.4 g/dL — ABNORMAL LOW (ref 12.0–15.0)
LYMPHS ABS: 2.1 10*3/uL (ref 0.7–4.0)
LYMPHS PCT: 12 %
Lymphocytes Relative: 13 %
Lymphs Abs: 1.7 10*3/uL (ref 0.7–4.0)
MCH: 22.4 pg — ABNORMAL LOW (ref 26.0–34.0)
MCH: 22.6 pg — ABNORMAL LOW (ref 26.0–34.0)
MCHC: 32.3 g/dL (ref 30.0–36.0)
MCHC: 32.4 g/dL (ref 30.0–36.0)
MCV: 69.3 fL — ABNORMAL LOW (ref 78.0–100.0)
MCV: 69.7 fL — ABNORMAL LOW (ref 78.0–100.0)
MONO ABS: 1.1 10*3/uL — AB (ref 0.1–1.0)
Metamyelocytes Relative: 0 %
Monocytes Absolute: 0.1 10*3/uL (ref 0.1–1.0)
Monocytes Relative: 7 %
Monocytes Relative: 1 %
Myelocytes: 0 %
NEUTROS PCT: 80 %
Neutro Abs: 12.9 10*3/uL — ABNORMAL HIGH (ref 1.7–7.7)
Neutro Abs: 12.5 10*3/uL — ABNORMAL HIGH (ref 1.7–7.7)
Neutrophils Relative %: 87 %
OTHER: 0 %
Other: 0 %
PLATELETS: 231 10*3/uL (ref 150–400)
PROMYELOCYTES ABS: 0 %
Platelets: 246 10*3/uL (ref 150–400)
RBC: 3.75 MIL/uL — AB (ref 3.87–5.11)
RBC: 3.9 MIL/uL (ref 3.87–5.11)
RDW: 14.8 % (ref 11.5–15.5)
RDW: 14.8 % (ref 11.5–15.5)
WBC: 16.1 10*3/uL — AB (ref 4.0–10.5)
WBC: 14.3 10*3/uL — AB (ref 4.0–10.5)
nRBC: 1 /100 WBC — ABNORMAL HIGH

## 2015-07-16 LAB — WET PREP, GENITAL
Clue Cells Wet Prep HPF POC: NONE SEEN
Sperm: NONE SEEN
Trich, Wet Prep: NONE SEEN
YEAST WET PREP: NONE SEEN

## 2015-07-16 LAB — CULTURE, OB URINE

## 2015-07-16 LAB — ABO/RH: ABO/RH(D): O POS

## 2015-07-16 LAB — RAPID URINE DRUG SCREEN, HOSP PERFORMED
AMPHETAMINES: NOT DETECTED
BENZODIAZEPINES: NOT DETECTED
Barbiturates: NOT DETECTED
Cocaine: NOT DETECTED
OPIATES: NOT DETECTED
Tetrahydrocannabinol: NOT DETECTED

## 2015-07-16 LAB — GROUP B STREP BY PCR: GROUP B STREP BY PCR: NEGATIVE

## 2015-07-16 LAB — FETAL FIBRONECTIN: Fetal Fibronectin: POSITIVE — AB

## 2015-07-16 LAB — TYPE AND SCREEN
ABO/RH(D): O POS
ANTIBODY SCREEN: NEGATIVE

## 2015-07-16 LAB — AMNISURE RUPTURE OF MEMBRANE (ROM) NOT AT ARMC: AMNISURE: NEGATIVE

## 2015-07-16 MED ORDER — DOCUSATE SODIUM 100 MG PO CAPS
100.0000 mg | ORAL_CAPSULE | Freq: Two times a day (BID) | ORAL | Status: DC
  Administered 2015-07-16 – 2015-07-17 (×2): 100 mg via ORAL
  Filled 2015-07-16 (×3): qty 1

## 2015-07-16 MED ORDER — BETAMETHASONE SOD PHOS & ACET 6 (3-3) MG/ML IJ SUSP
12.0000 mg | Freq: Once | INTRAMUSCULAR | Status: AC
  Administered 2015-07-16: 12 mg via INTRAMUSCULAR
  Filled 2015-07-16: qty 2

## 2015-07-16 MED ORDER — ACETAMINOPHEN 325 MG PO TABS
650.0000 mg | ORAL_TABLET | ORAL | Status: DC | PRN
  Administered 2015-07-16: 650 mg via ORAL
  Filled 2015-07-16: qty 2

## 2015-07-16 MED ORDER — CYCLOBENZAPRINE HCL 10 MG PO TABS
10.0000 mg | ORAL_TABLET | Freq: Three times a day (TID) | ORAL | Status: DC | PRN
  Administered 2015-07-16: 10 mg via ORAL
  Filled 2015-07-16 (×2): qty 1

## 2015-07-16 MED ORDER — DOCUSATE SODIUM 100 MG PO CAPS
100.0000 mg | ORAL_CAPSULE | Freq: Every day | ORAL | Status: DC
  Administered 2015-07-16: 100 mg via ORAL

## 2015-07-16 MED ORDER — LACTATED RINGERS IV SOLN
INTRAVENOUS | Status: DC
  Administered 2015-07-16 – 2015-07-17 (×4): via INTRAVENOUS

## 2015-07-16 MED ORDER — LACTATED RINGERS IV BOLUS (SEPSIS)
1000.0000 mL | Freq: Once | INTRAVENOUS | Status: AC
  Administered 2015-07-16: 1000 mL via INTRAVENOUS

## 2015-07-16 MED ORDER — PIPERACILLIN-TAZOBACTAM 3.375 G IVPB
3.3750 g | Freq: Three times a day (TID) | INTRAVENOUS | Status: DC
  Administered 2015-07-16 – 2015-07-17 (×3): 3.375 g via INTRAVENOUS
  Filled 2015-07-16 (×4): qty 50

## 2015-07-16 MED ORDER — PRENATAL MULTIVITAMIN CH
1.0000 | ORAL_TABLET | Freq: Every day | ORAL | Status: DC
  Administered 2015-07-16: 1 via ORAL
  Filled 2015-07-16: qty 1

## 2015-07-16 MED ORDER — BUTORPHANOL TARTRATE 1 MG/ML IJ SOLN
2.0000 mg | Freq: Once | INTRAMUSCULAR | Status: AC
  Administered 2015-07-16: 2 mg via INTRAVENOUS
  Filled 2015-07-16: qty 2

## 2015-07-16 MED ORDER — LEVOTHYROXINE SODIUM 75 MCG PO TABS
75.0000 ug | ORAL_TABLET | Freq: Every day | ORAL | Status: DC
  Administered 2015-07-17: 75 ug via ORAL
  Filled 2015-07-16 (×2): qty 1

## 2015-07-16 MED ORDER — POLYETHYLENE GLYCOL 3350 17 G PO PACK
17.0000 g | PACK | Freq: Two times a day (BID) | ORAL | Status: DC
  Administered 2015-07-17: 17 g via ORAL
  Filled 2015-07-16: qty 1

## 2015-07-16 MED ORDER — LACTATED RINGERS IV BOLUS (SEPSIS)
500.0000 mL | Freq: Once | INTRAVENOUS | Status: AC
  Administered 2015-07-16: 500 mL via INTRAVENOUS

## 2015-07-16 MED ORDER — CALCIUM CARBONATE ANTACID 500 MG PO CHEW: 2.0000 | CHEWABLE_TABLET | ORAL | Status: DC | PRN

## 2015-07-16 MED ORDER — SODIUM CHLORIDE 0.9 % IV SOLN: 75.0000 mg/kg | Freq: Three times a day (TID) | INTRAVENOUS | Status: DC

## 2015-07-16 MED ORDER — BETAMETHASONE SOD PHOS & ACET 6 (3-3) MG/ML IJ SUSP
12.0000 mg | Freq: Once | INTRAMUSCULAR | Status: AC
  Administered 2015-07-17: 12 mg via INTRAMUSCULAR
  Filled 2015-07-16: qty 2

## 2015-07-16 MED ORDER — OXYCODONE-ACETAMINOPHEN 5-325 MG PO TABS
1.0000 | ORAL_TABLET | ORAL | Status: DC | PRN
  Administered 2015-07-16 (×5): 2 via ORAL
  Administered 2015-07-17: 1 via ORAL
  Filled 2015-07-16 (×6): qty 2

## 2015-07-16 MED ORDER — ZOLPIDEM TARTRATE 5 MG PO TABS: 5.0000 mg | ORAL_TABLET | Freq: Every evening | ORAL | Status: DC | PRN

## 2015-07-16 NOTE — Progress Notes (Signed)
Patient ID: Meghan Parsons, female   DOB: May 14, 1975, 40 y.o.   MRN: [id number] Pt still c/o pain in the gluteal fold .avss Gluteal fold and buttock examined.  No rash or lesions seen.  No pain with palpation Continue pain meds.  I am not sure what is  Causing the pain.  Pt also consitpated.  Will start miralax

## 2015-07-16 NOTE — Progress Notes (Signed)
Patient ID: Meghan Parsons, female   DOB: 1975/10/07, 40 y.o.   MRN: [id number] Pt still with rectal pain and now fever and chills BP 125/44 mmHg  Pulse 129  Temp(Src) 99.2 F (37.3 C) (Oral)  Resp 17  Ht 5\' 7"  (1.702 m)  Wt 260 lb (117.935 kg)  BMI 40.71 kg/m2  SpO2 97% Rectal normal tone . No masses palpated Fever of unknown origin.  Flu neg.  Will do MRI to RO abscess ID consulted they recommend zosyn they will see her in the morning Appreciate MFM consult.  Will r/o rectal or abdominal abscess nicu consult Pt is anemic type and screen is current [place] will follow weekly [place]

## 2015-07-16 NOTE — Progress Notes (Signed)
MRI order changed from routine to stat, [facility] and care link called. RROB notified as well

## 2015-07-16 NOTE — Progress Notes (Signed)
Kaytlyn Milroy Labat is a 40 y.o. G1P0 at [redacted]w[redacted]d by ultrasound admitted for marginal abruption and gluteal pain ... Now with fever of unknown origin. In to assess patient due to report from Midwife of patients lethargy and new onset tachypnea   Subjective: Patient denies difficulty breathing. She does report pain under her right breast that is of new onset.( It is substernal ) She has continued throbbing intermittent gluteal pain that is worse with movement.    Objective: BP 104/56 mmHg  Pulse 123  Temp(Src) 100.7 F (38.2 C) (Oral)  Resp 26  Ht 5\' 7"  (1.702 m)  Wt 260 lb (117.935 kg)  BMI 40.71 kg/m2  SpO2 95%      General: pt is alerts and oriented. No evidence of lethargy currently.. Standing and ambulating from the restroom upon my entrance in the room CV tachycardic regular Rhythm Lungs clear bilaterally with breath sounds throughout Abdomen gravid nontender  Extremities. Slight edema bilaterally no pitting edema noted. No calf tenderness  FHT baseline 180 with moderate acceleration no decelerations   Labs: Lab Results  Component Value Date   WBC 14.3* 07/16/2015   HGB 8.8* 07/16/2015   HCT 27.2* 07/16/2015   MCV 69.7* 07/16/2015   PLT 246 07/16/2015    Assessment / Plan: 24 wks and 1 day with marginal abruption. Minimal bleeding currently .Marland Kitchen  Oligohydramnios.. Continue IV fluids. Ultrasound weekly .Marland Kitchen Continuous Fetal monitoring.  Fetal tachycardia most likely due to current fever.. Baseline will likely decrease as temperature decreases  Fever of unknown origin. .. Given new onset tachypnea / and substernal pain .Marland Kitchen Will order spiral CT to rule out PE Then follow with MRI of the pelvis and abdomen without contrast.  Continue Zosyn currently Day 1- ID consult placed and they plan to see patient tomorrow morning per Dr. Gerri Lins J. 07/16/2015, 11:49 PM

## 2015-07-16 NOTE — MAU Provider Note (Signed)
Meghan Parsons is a 40 y.o. G1P0 at 24.1 weeks presented unannounced c/o severe pain in the gluteal fold and VB.  Denies lof w/+FM.  Pt request pain medicine.  Pt offered IV med or percocet.  Pt selected IV pain medicine.  Pt in MAU on 07/14/15 for fever at home and body ache, Flu swab neg.  US done for occasional variable decels.   On 07/14/15 [place] reported Oligo - single pocket of fluid 2x2 and a large uterine myoma 5.7x6.5x4.6.  Pt Dc per Dr Alesia Richards      History     There are no active problems to display for this patient.   Chief Complaint  Patient presents with  . Tailbone Pain   HPI  OB History    Gravida Para Term Preterm AB TAB SAB Ectopic Multiple Living   1               Past Medical History  Diagnosis Date  . Medical history non-contributory     Past Surgical History  Procedure Laterality Date  . Dilatation & currettage/hysteroscopy with resectocope N/A 09/24/2013    Procedure: [place];  Surgeon: Delice Lesch, MD;  Location: [facility];  Service: Gynecology;  Laterality: N/A;    History reviewed. No pertinent family history.  Social History  Substance Use Topics  . Smoking status: Never Smoker   . Smokeless tobacco: Never Used  . Alcohol Use: No    Allergies: No Known Allergies  Prescriptions prior to admission  Medication Sig Dispense Refill Last Dose  . acetaminophen (TYLENOL) 500 MG tablet Take 1,000 mg by mouth every 6 (six) hours as needed for mild pain or moderate pain.   07/15/2015 at Unknown time  . ferrous sulfate 325 (65 FE) MG tablet Take 325 mg by mouth at bedtime.   Past Week at Unknown time  . ibuprofen (ADVIL,MOTRIN) 200 MG tablet Take 400 mg by mouth every 6 (six) hours as needed for mild pain.   Past Week at Unknown time  . Prenatal Vit-Fe Fumarate-FA (PRENATAL MULTIVITAMIN) TABS tablet Take 1 tablet by mouth at bedtime.   07/14/2015 at Unknown time    ROS See HPI above, all other systems are  negative  Physical Exam   Blood pressure 99/58, pulse 107, temperature 98.7 F (37.1 C), temperature source Oral, resp. rate 18.  Physical Exam Ext:  WNL ABD: Soft, non tender to palpation, no rebound or guarding SVE: c/t/h. Vaginal bleeding and mucus present in the vault Skin: gluteal fold unremarkable, skin shinny but unbroken   ED Course  Assessment: IUP at  24.1weeks Membranes: intact FHR: Category 1 CTX:  none minutes   Plan: Stadol [place]    Tullio Chausse, CNM, MSN 07/16/2015. 12:03 AM   MAU Addendum Note  Results for orders placed or performed during the hospital encounter of 07/15/15 (from the past 24 hour(s))  Wet prep, genital     Status: Abnormal   Collection Time: 07/15/15 11:59 PM  Result Value Ref Range   Yeast Wet Prep HPF POC NONE SEEN NONE SEEN   Trich, Wet Prep NONE SEEN NONE SEEN   Clue Cells Wet Prep HPF POC NONE SEEN NONE SEEN   WBC, Wet Prep HPF POC MANY (A) NONE SEEN   Sperm NONE SEEN   Fetal fibronectin     Status: Abnormal   Collection Time: 07/15/15 11:59 PM  Result Value Ref Range   Fetal Fibronectin POSITIVE (A) NEGATIVE   [place]: placental  abruption noted posterior to placenta approx. 5.6 x 3.5 x 1.2cm Vertex, oligo larges pocket 2.1   Plan: Admit to AP MFM consult in the am Consulted with Dr. Marena Chancy Cynai Skeens, CNM, MSN 07/16/2015. 1:36 AM

## 2015-07-16 NOTE — Progress Notes (Signed)
MFM Consult  40 year old, G1, at 24 weeks 1 day gestation with IVF pregnancy with an EDD of 11/04/2015 for mfm consult   She notes that this pregnancy had been uncomplicated until about 2-3 weeks ago when she develop "fever (sweats)" at home and dull generalized body ache. She developed left shoulder ache without numbness or neurologic symptoms early this week and started have short episodes of sharp pain about 6cm above the anus on Wednesday. She also notes posterior headaches starting about that time, pain in the eyes with extreme lateral movement, and abdominal acheness across the upper colon area. She notes bowel movements every week prior to pregnancy and every 2-3 days during pregnancy. She denies worsening of her symptoms with bowel movement although last was on Monday this week. No blood noted in the stool. She did have vaginal bleeding yesterday and today without anterior abdominal cramping or uterine contractions. She notes fetal movement but denies membrane rupture symptoms.   Past Medical History  Diagnosis Date  . Hypothyroidism   . Anemia   . Hyperprolactinemia [facility])    Past Surgical History  Procedure Laterality Date  . Dilatation & currettage/hysteroscopy with resectocope N/A 09/24/2013    Procedure: [place];  Surgeon: Delice Lesch, MD;  Location: [facility];  Service: Gynecology;  Laterality: N/A;  . Dilation and curettage of uterus    . Ivf     No Known Allergies  Social History   Social History  . Marital Status: Married    Spouse Name: N/A  . Number of Children: N/A  . Years of Education: N/A   Occupational History  . Not on file.   Social History Main Topics  . Smoking status: Never Smoker   . Smokeless tobacco: Never Used  . Alcohol Use: No  . Drug Use: No  . Sexual Activity: Yes    Birth Control/ Protection: None   Other Topics Concern  . Not on file   Social History Narrative   Obstetric History   G1   P0    T0   P0   A0   TAB0   SAB0   E0   M0   L0     # Outcome Date GA Lbr Len/2nd Weight Sex Delivery Anes PTL Lv  1 Current              #1 Intrauterine bleed  - Right lateral placental with subchorionic bleed measuring 5.6 x 3.5 x 1.2 cm on ultrasound.  - MBT positive - Given #1 and #2, would admit for antenatal steroid, IV fluids, NICU consult and consider Magnesium for neurprotection #2 Oligohydramnios - Largest pocket 2.1cm - Serial ultrasounds weekly for further evaluation #3 Spasmodic central mid deep gluteal pain possible rectal spasm - does not appear to be anal, but higher in the rectal region lasting 2-5 seconds with no symptoms in between. She does have a higher white count than I would expect for pregnancy which may suggest an infectious etiology. The spasmodic so short in character would be unusual for uterine contractions and would also be unusual for chorioamniotis  Recommend  ?GI consult to look for possible rectal abscess or bowel etiology ?A digital rectal examination with inspection of the perianal skin for fissures, ulcers, external hemorrhoids, or prolapsed internal hemorrhoids; and palpation for thrombosed internal hemorrhoids and tenderness to palpation of the coccyx suggestive of coccydynia. ?Laboratory studies including serial complete blood count, electrolytes, and markers of inflammation including a C-reactive protein (  CRP). These laboratory studies are normal in patients with proctalgia fugax and, if elevated, are suggestive of another etiology of rectal pain. ?Colonoscopy or flexible sigmoidoscopy to rule out inflammatory bowel disease, ischemic colitis, and rectal cancer if initial evaluations above by GI are negative ?Consider in patients with fever or leukocytosis an endoanal ultrasound or magnetic resonance imaging of the abdomen and pelvis. In patients with concurrent constipation consider anorectal manometry to rule out pelvic floor dysfunction.   Questions  appear answered to the couples satisfaction. Precautions for the above given. Spent greater than 1/2 of 40 minute visit face to face counseling

## 2015-07-16 NOTE — H&P (Signed)
BERENIZ TOCCI is a 40 y.o. 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  On 07/16/15 [place] reported Oligo - largest pocket of fluid 2x1 and placental abruption noted posterior to placenta approx. 5.6 x 3.5 x 1.2cm  Patient Active Problem List   Diagnosis Date Noted  . 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   EDC of 11/04/15 was established by LMP.      [place] evaluations:  20.5 weeks - Anatomy: EFW 13oz, FHR 155, cervical length 4.49,  Breech, normal fluid, w fibroids    Significant prenatal events:   Anemia, BMI 37, fibroids   Last evaluation:   23.6 weeks     Reason for admission:  abruption  Pt States:   Contractions Frequency: none         Contraction severity: n/a         Fetal activity: +FM  OB History    Gravida Para Term Preterm AB TAB SAB Ectopic Multiple Living   1              Past Medical History  Diagnosis Date  . Medical history non-contributory    Past Surgical History  Procedure Laterality Date  . Dilatation & currettage/hysteroscopy with resectocope N/A 09/24/2013    Procedure: [place];  Surgeon: Delice Lesch, MD;  Location: [facility];  Service: Gynecology;  Laterality: N/A;  . Dilation and curettage of uterus    . Ivf     Family History: family history includes Hypertension in her father. Social History:  reports that she has never smoked. She has never used smokeless tobacco. She reports that she does not drink alcohol or use illicit drugs.   Prenatal Transfer Tool  Maternal  Diabetes: No Genetic Screening: Declined Maternal Ultrasounds/Referrals: Normal Fetal Ultrasounds or other Referrals:  None Maternal Substance Abuse:  No Significant Maternal Medications:  None Significant Maternal Lab Results: pending   ROS:  See HPI above, all other systems are negative  No Known Allergies  Dilation: Closed Exam by:: Kaysia Willard CNM  Blood pressure 127/67, pulse 123, temperature 98.7 F (37.1 C), temperature source Oral, resp. rate 18.  Maternal Exam:  Uterine Assessment: Contraction frequency is rare.  Abdomen: Gravid, non tender. Fundal height is aga.  Normal external genitalia, vulva, cervix, uterus and adnexa.  No lesions noted on exam.  Pelvis adequate for delivery.  Fetal presentation: Vertex by US  Fetal Exam:  Monitor Surveillance : Continuous Monitoring Mode: Ultrasound.  NICHD: Category 2 CTXs: none EFW   1lb 5oz lbs  Physical Exam: Nursing note and vitals reviewed General: alert and cooperative She appears well nourished Psychiatric: Normal mood and affect. Her behavior is normal Head: Normocephalic Eyes: Pupils are equal, round, and reactive to light Neck: Normal range of motion Cardiovascular: RRR without murmur  Respiratory: CTAB. Effort normal  Abd: soft, non-tender, +BS, no rebound, no guarding  Genitourinary: Vagina normal  Neurological: A&Ox3 Skin: Warm and dry  Musculoskeletal: Normal range of motion  Homan's sign negative bilaterally No evidence of DVTs.  Edema: minimal bilaterally non-pitting edema DTR: 2+ Clonus: None   Prenatal  labs: ABO, Rh: --/--/PENDING (04/28 0134) Antibody: PENDING (04/28 0134) Rubella:  immune RPR: Nonreactive (01/27 0000)  HBsAg: Negative (01/27 0000)  HIV: Non-reactive (01/27 0000)  GBS:  pending Sickle cell/Hgb electrophoresis:  WNL Pap:  04/25/15 wnl GC: neg    Chlamydia: neg Genetic screenings:   declined Glucola:  wnl  Assessment:  IUP at 24.1 weeks NICHD: Category  2 Membranes: intact GBS pending Diagnosis:  Placenta abruption  Plan:  Admit to Antepartum unit BMZ GBS  IVF at 1100cc/hr Routing [facility] orders IV pain medication per orders Prophylaxis abx if necessary Diet: regular    Jacolby Risby, CNM, MSN 07/16/2015, 2:29 AM

## 2015-07-16 NOTE — Progress Notes (Signed)
Antepartum LOS: 0 Meghan Parsons, 40 y.o.,   OB History    Gravida Para Term Preterm AB TAB SAB Ectopic Multiple Living   1               Subjective -Patient resting in bed.  Reports aching pain in gluteal fold.  Also reports upper abdominal cramping.  Patient endorses fetal movement and VB with wiping.  However, she denies LOF.  Patient currently taking percocet, states has minimal effect.  Patient denies issues with urination, but reports constipation since Monday.    Objective  Filed Vitals:   07/16/15 0922 07/16/15 1007 07/16/15 1025 07/16/15 1121  BP:      Pulse:      Temp:      TempSrc:      Resp: 20 20 20 20   Height:      Weight:      SpO2:        Results for orders placed or performed during the hospital encounter of 07/15/15 (from the past 24 hour(s))  Urine rapid drug screen (hosp performed)     Status: None   Collection Time: 07/15/15  9:38 PM  Result Value Ref Range   Opiates NONE DETECTED NONE DETECTED   Cocaine NONE DETECTED NONE DETECTED   Benzodiazepines NONE DETECTED NONE DETECTED   Amphetamines NONE DETECTED NONE DETECTED   Tetrahydrocannabinol NONE DETECTED NONE DETECTED   Barbiturates NONE DETECTED NONE DETECTED  Wet prep, genital     Status: Abnormal   Collection Time: 07/15/15 11:59 PM  Result Value Ref Range   Yeast Wet Prep HPF POC NONE SEEN NONE SEEN   Trich, Wet Prep NONE SEEN NONE SEEN   Clue Cells Wet Prep HPF POC NONE SEEN NONE SEEN   WBC, Wet Prep HPF POC MANY (A) NONE SEEN   Sperm NONE SEEN   Fetal fibronectin     Status: Abnormal   Collection Time: 07/15/15 11:59 PM  Result Value Ref Range   Fetal Fibronectin POSITIVE (A) NEGATIVE  CBC with Differential/Platelet     Status: Abnormal   Collection Time: 07/16/15  1:34 AM  Result Value Ref Range   WBC 16.1 (H) 4.0 - 10.5 K/uL   RBC 3.75 (L) 3.87 - 5.11 MIL/uL   Hemoglobin 8.4 (L) 12.0 - 15.0 g/dL   HCT 26.0 (L) 36.0 - 46.0 %   MCV 69.3 (L) 78.0 - 100.0 fL   MCH 22.4 (L) 26.0 -  34.0 pg   MCHC 32.3 30.0 - 36.0 g/dL   RDW 14.8 11.5 - 15.5 %   Platelets 231 150 - 400 K/uL   Neutrophils Relative % 80 %   Lymphocytes Relative 13 %   Monocytes Relative 7 %   Eosinophils Relative 0 %   Basophils Relative 0 %   Other 0 %   Neutro Abs 12.9 (H) 1.7 - 7.7 K/uL   Lymphs Abs 2.1 0.7 - 4.0 K/uL   Monocytes Absolute 1.1 (H) 0.1 - 1.0 K/uL   Eosinophils Absolute 0.0 0.0 - 0.7 K/uL   Basophils Absolute 0.0 0.0 - 0.1 K/uL  Type and screen [place]     Status: None   Collection Time: 07/16/15  1:34 AM  Result Value Ref Range   ABO/RH(D) O POS    Antibody Screen NEG    Sample Expiration 07/19/2015   ABO/Rh     Status: None   Collection Time: 07/16/15  1:34 AM  Result Value Ref Range   ABO/RH(D)  O POS   Group B strep by PCR     Status: None   Collection Time: 07/16/15  3:40 AM  Result Value Ref Range   Group B strep by PCR NEGATIVE NEGATIVE  Amnisure rupture of membrane (rom)not at [facility]     Status: None   Collection Time: 07/16/15  3:40 AM  Result Value Ref Range   Amnisure ROM NEGATIVE     Meds: Scheduled Meds: . [START ON 07/17/2015] betamethasone acetate-betamethasone sodium phosphate  12 mg Intramuscular Once  . docusate sodium  100 mg Oral BID  . prenatal multivitamin  1 tablet Oral Q1200   Continuous Infusions: . lactated ringers 100 mL/hr at 07/16/15 0818   PRN Meds:.acetaminophen, calcium carbonate, cyclobenzaprine, oxyCODONE-acetaminophen, zolpidem   Physical Exam  Constitutional: She is oriented to person, place, and time. She appears well-developed and well-nourished. She appears distressed (With movement).  HENT:  Head: Normocephalic and atraumatic.  Eyes: Conjunctivae are normal.  Neck: Normal range of motion.  Cardiovascular: Regular rhythm.   Pulmonary/Chest: Effort normal.  Abdominal: Soft. Bowel sounds are normal.  Musculoskeletal:  Tenderness in gluteal fold ~5cm below tail bone  Neurological: She is alert and  oriented to person, place, and time.  Skin: Skin is warm and dry.  Psychiatric: She has a normal mood and affect. Her behavior is normal.  :   Monitoring Type: Continuous FHR:140 bpm, Mod Var, -Decels, +Accels UC: None  Assessment IUP at [redacted]w[redacted]d Placenta Abruption Oligohydramnios Hypothyroidism Pain in Gluteal Fold  Plan MFM Consult Pending Flexeril for suspected MS pain Colace for constipation Continue current plan of care Upcoming Treatments/Tests: TSH, Restart Synthroid 61mcg daily Dr.ND updated and to follow as appropriate  Maryann Conners, MSN, CNM 07/16/2015, 11:43 AM

## 2015-07-16 NOTE — MAU Note (Signed)
Notified provider that patient's ultrasound report was back.

## 2015-07-17 ENCOUNTER — Inpatient Hospital Stay (HOSPITAL_COMMUNITY): Payer: Medicaid Other

## 2015-07-17 DIAGNOSIS — M533 Sacrococcygeal disorders, not elsewhere classified: Secondary | ICD-10-CM

## 2015-07-17 DIAGNOSIS — M25512 Pain in left shoulder: Secondary | ICD-10-CM

## 2015-07-17 DIAGNOSIS — D72829 Elevated white blood cell count, unspecified: Secondary | ICD-10-CM

## 2015-07-17 DIAGNOSIS — R509 Fever, unspecified: Secondary | ICD-10-CM

## 2015-07-17 DIAGNOSIS — R7881 Bacteremia: Secondary | ICD-10-CM

## 2015-07-17 LAB — COMPREHENSIVE METABOLIC PANEL
ALBUMIN: 2.6 g/dL — AB (ref 3.5–5.0)
ALT: 15 U/L (ref 14–54)
AST: 16 U/L (ref 15–41)
Alkaline Phosphatase: 76 U/L (ref 38–126)
Anion gap: 9 (ref 5–15)
BUN: 6 mg/dL (ref 6–20)
CHLORIDE: 104 mmol/L (ref 101–111)
CO2: 21 mmol/L — AB (ref 22–32)
CREATININE: 0.59 mg/dL (ref 0.44–1.00)
Calcium: 8.3 mg/dL — ABNORMAL LOW (ref 8.9–10.3)
GFR calc Af Amer: >60 mL/min (ref >60)
GFR calc non Af Amer: >60 mL/min (ref >60)
GLUCOSE: 131 mg/dL — AB (ref 65–99)
POTASSIUM: 3.7 mmol/L (ref 3.5–5.1)
SODIUM: 134 mmol/L — AB (ref 135–145)
Total Bilirubin: 1.1 mg/dL (ref 0.3–1.2)
Total Protein: 6.2 g/dL — ABNORMAL LOW (ref 6.5–8.1)

## 2015-07-17 LAB — CBC WITH DIFFERENTIAL/PLATELET
BASOS ABS: 0 10*3/uL (ref 0.0–0.1)
Basophils Relative: 0 %
EOS ABS: 0 10*3/uL (ref 0.0–0.7)
EOS PCT: 0 %
HCT: 25.9 % — ABNORMAL LOW (ref 36.0–46.0)
HEMOGLOBIN: 8.3 g/dL — AB (ref 12.0–15.0)
LYMPHS ABS: 1.9 10*3/uL (ref 0.7–4.0)
LYMPHS PCT: 9 %
MCH: 22.1 pg — AB (ref 26.0–34.0)
MCHC: 32 g/dL (ref 30.0–36.0)
MCV: 69.1 fL — ABNORMAL LOW (ref 78.0–100.0)
Monocytes Absolute: 0.9 10*3/uL (ref 0.1–1.0)
Monocytes Relative: 4 %
NEUTROS PCT: 87 %
Neutro Abs: 18.2 10*3/uL — ABNORMAL HIGH (ref 1.7–7.7)
PLATELETS: 223 10*3/uL (ref 150–400)
RBC: 3.75 MIL/uL — AB (ref 3.87–5.11)
RDW: 14.6 % (ref 11.5–15.5)
WBC: 20.9 10*3/uL — AB (ref 4.0–10.5)

## 2015-07-17 LAB — TSH: TSH: 3.267 u[IU]/mL (ref 0.350–4.500)

## 2015-07-17 LAB — AMNISURE RUPTURE OF MEMBRANE (ROM) NOT AT ARMC
AMNISURE: NEGATIVE
Amnisure ROM: NEGATIVE

## 2015-07-17 LAB — LACTIC ACID, PLASMA: Lactic Acid, Venous: 0.9 mmol/L (ref 0.5–2.0)

## 2015-07-17 LAB — T4, FREE: FREE T4: 0.77 ng/dL (ref 0.61–1.12)

## 2015-07-17 MED ORDER — VANCOMYCIN HCL 10 G IV SOLR
2500.0000 mg | Freq: Once | INTRAVENOUS | Status: AC
  Administered 2015-07-17: 2500 mg via INTRAVENOUS
  Filled 2015-07-17: qty 2500

## 2015-07-17 MED ORDER — LACTATED RINGERS IV BOLUS (SEPSIS)
250.0000 mL | Freq: Once | INTRAVENOUS | Status: AC
  Administered 2015-07-17: 250 mL via INTRAVENOUS

## 2015-07-17 MED ORDER — VANCOMYCIN HCL 10 G IV SOLR
1250.0000 mg | Freq: Three times a day (TID) | INTRAVENOUS | Status: DC
  Filled 2015-07-17: qty 1250

## 2015-07-17 MED ORDER — MAGNESIUM SULFATE BOLUS VIA INFUSION
4.0000 g | Freq: Once | INTRAVENOUS | Status: AC
  Administered 2015-07-17: 4 g via INTRAVENOUS
  Filled 2015-07-17: qty 500

## 2015-07-17 MED ORDER — ACETAMINOPHEN 500 MG PO TABS: 1000.0000 mg | ORAL_TABLET | Freq: Three times a day (TID) | ORAL | Status: DC | PRN

## 2015-07-17 MED ORDER — LACTATED RINGERS IV BOLUS (SEPSIS)
500.0000 mL | Freq: Once | INTRAVENOUS | Status: AC
  Administered 2015-07-17: 500 mL via INTRAVENOUS

## 2015-07-17 MED ORDER — OXYCODONE HCL 5 MG PO TABS
5.0000 mg | ORAL_TABLET | ORAL | Status: DC | PRN
  Administered 2015-07-17 (×2): 10 mg via ORAL
  Filled 2015-07-17 (×2): qty 2

## 2015-07-17 MED ORDER — IOPAMIDOL (ISOVUE-370) INJECTION 76%
100.0000 mL | Freq: Once | INTRAVENOUS | Status: AC | PRN
  Administered 2015-07-17: 100 mL via INTRAVENOUS

## 2015-07-17 MED ORDER — MAGNESIUM SULFATE 50 % IJ SOLN
2.0000 g/h | INTRAVENOUS | Status: DC
  Filled 2015-07-17: qty 80

## 2015-07-17 NOTE — Consult Note (Signed)
Neonatology Consult to Antenatal Patient: 07/17/2015 2:09 PM    I was requested by Dr Landry Mellow to see this patient in order to provide antenatal counseling due to suspected chorioamnionitis at 24 2/[redacted] weeks gestation.    Ms.Colegrove is a 40 y/o Primigravida who was admitted yesterday and is now 24 2/[redacted]  weeks GA.  Pregnancy product of IVF.  Prenatal history significant for oligohydramnios, hypothyroidism,  marginal abruption, gluteal pain and fever of unknown origin (max temp of 103.1 at around 0100 today). She is currently "not" having active labor.  She received a course of BMZ and on Magnesium Sulfate for neuroprotection and IV antibiotics Vanco and Zosyn per ID recommendation.   Amniosure done x3 to rule out PROM were all negative.  I spoke with Ms.Marsteller and FOB in Room 149.   We discussed in detail what to expect in case of possible delivery of the infant in the next few days including morbidity and mortality at this gestational age, usual delivery room resuscitation including intubation and surfactant administration in the DR.  Discussed possible respiratory complications and need for support including mechanical ventilation, IV access, sepsis work-up, risk for IVH with the potential for motor/cognitive deficits, length of stay and long-term outcome.  They were attentive, had appropriate questions, and expressed appreciation for my input.  They had questions regarding what will be the plan for delivery but I told them this will be up to the decision of their OB, Dr. Landry Mellow.  Thank you for asking [place] to see this patient and allowing [place] to participate in her care.  Please call if there are any further questions.   Amedeo Gory, MD (Attending Neonatologist)  Total length of face-to-face or floor/unit time for this encounter was 40 minutes. Counseling and/or coordination of care was greater than fifty percent of the time.

## 2015-07-17 NOTE — Progress Notes (Signed)
Meghan Parsons is a 40 y.o. G1P0 at [redacted]w[redacted]d by ultrasound admitted for marginal abruption and gluteal pain. Now with fever of unknown origin   Subjective: Pt states that she feels better this morning. She still has gluteal pain. She was unable to lie flat to complete the MRI yesterday only partial scan was done. She denies abdominal pain or tenderness.  She has constipation and has not had a bowel movement in 5 days. She is afraid to have a bowel movement.  .. Her vaginal bleeding has subsided. She has only scant blood tinged discharge. . She denies sob or difficulty breathing . No nausea or emesis.   Objective: BP 100/48 mmHg  Pulse 115  Temp(Src) 98.3 F (36.8 C) (Oral)  Resp 22  Ht 5\' 7"  (1.702 m)  Wt 265 lb 8 oz (120.43 kg)  BMI 41.57 kg/m2  SpO2 99%     General Alert and oriented NAD CV rrr Lungs Clear  Abdomen Gravid nontender Ext edema present no pitting edema no calf tenderness  Gluteal fold examined pt tender to palpation of gluteal fold no lesions seen no abscess seen  FHT:  FHR: 145 bpm, variability: moderate,  accelerations:  Present,  decelerations:  Present variable  UC:   none  Labs: Lab Results  Component Value Date   WBC 20.9* 07/17/2015   HGB 8.3* 07/17/2015   HCT 25.9* 07/17/2015   MCV 69.1* 07/17/2015   PLT 223 07/17/2015   Results for orders placed or performed during the hospital encounter of 07/15/15 (from the past 24 hour(s))  CBC with Differential/Platelet     Status: Abnormal   Collection Time: 07/16/15  6:05 PM  Result Value Ref Range   WBC 14.3 (H) 4.0 - 10.5 K/uL   RBC 3.90 3.87 - 5.11 MIL/uL   Hemoglobin 8.8 (L) 12.0 - 15.0 g/dL   HCT 27.2 (L) 36.0 - 46.0 %   MCV 69.7 (L) 78.0 - 100.0 fL   MCH 22.6 (L) 26.0 - 34.0 pg   MCHC 32.4 30.0 - 36.0 g/dL   RDW 14.8 11.5 - 15.5 %   Platelets 246 150 - 400 K/uL   Neutrophils Relative % 87 %   Lymphocytes Relative 12 %   Monocytes Relative 1 %   Eosinophils Relative 0 %   Basophils Relative 0 %    Band Neutrophils 0 %   Metamyelocytes Relative 0 %   Myelocytes 0 %   Promyelocytes Absolute 0 %   Blasts 0 %   nRBC 1 (H) 0 /100 WBC   Other 0 %   Neutro Abs 12.5 (H) 1.7 - 7.7 K/uL   Lymphs Abs 1.7 0.7 - 4.0 K/uL   Monocytes Absolute 0.1 0.1 - 1.0 K/uL   Eosinophils Absolute 0.0 0.0 - 0.7 K/uL   Basophils Absolute 0.0 0.0 - 0.1 K/uL   RBC Morphology POLYCHROMASIA PRESENT   Culture, blood (Routine X 2) w Reflex to ID Panel     Status: None (Preliminary result)   Collection Time: 07/16/15  7:50 PM  Result Value Ref Range   Specimen Description BLOOD LEFT ARM    Special Requests BOTTLES DRAWN AEROBIC AND ANAEROBIC 10ML EA    Culture  Setup Time      GRAM POSITIVE [facility]I IN CLUSTERS IN BOTH AEROBIC AND ANAEROBIC BOTTLES CRITICAL RESULT CALLED TO, READ BACK BY AND VERIFIED WITH: K HAYNES,RN AT [id number] 07/17/15 BY L BENFIELD Performed at [facility]  Report Status PENDING   Culture, blood (Routine X 2) w Reflex to ID Panel     Status: None (Preliminary result)   Collection Time: 07/16/15  7:54 PM  Result Value Ref Range   Specimen Description BLOOD LEFT ARM    Special Requests BOTTLES DRAWN AEROBIC AND ANAEROBIC 10ML EA    Culture  Setup Time      GRAM POSITIVE COCCI IN CLUSTERS AEROBIC BOTTLE ONLY CRITICAL RESULT CALLED TO, READ BACK BY AND VERIFIED WITH: K HAYNES,RN AT [id number] 07/17/15 BY L BENFIELD Performed at [place] [facility]    Report Status PENDING   CBC with Differential/Platelet     Status: Abnormal   Collection Time: 07/17/15  5:12 AM  Result Value Ref Range   WBC 20.9 (H) 4.0 - 10.5 K/uL   RBC 3.75 (L) 3.87 - 5.11 MIL/uL   Hemoglobin 8.3 (L) 12.0 - 15.0 g/dL   HCT 25.9 (L) 36.0 - 46.0 %   MCV 69.1 (L) 78.0 - 100.0 fL   MCH 22.1 (L) 26.0 - 34.0 pg   MCHC 32.0 30.0 - 36.0 g/dL   RDW 14.6 11.5 - 15.5 %   Platelets 223 150 - 400 K/uL   Neutrophils Relative % 87 %   Neutro Abs 18.2 (H) 1.7 - 7.7  K/uL   Lymphocytes Relative 9 %   Lymphs Abs 1.9 0.7 - 4.0 K/uL   Monocytes Relative 4 %   Monocytes Absolute 0.9 0.1 - 1.0 K/uL   Eosinophils Relative 0 %   Eosinophils Absolute 0.0 0.0 - 0.7 K/uL   Basophils Relative 0 %   Basophils Absolute 0.0 0.0 - 0.1 K/uL  Comprehensive metabolic panel     Status: Abnormal   Collection Time: 07/17/15  5:12 AM  Result Value Ref Range   Sodium 134 (L) 135 - 145 mmol/L   Potassium 3.7 3.5 - 5.1 mmol/L   Chloride 104 101 - 111 mmol/L   CO2 21 (L) 22 - 32 mmol/L   Glucose, Bld 131 (H) 65 - 99 mg/dL   BUN 6 6 - 20 mg/dL   Creatinine, Ser 0.59 0.44 - 1.00 mg/dL   Calcium 8.3 (L) 8.9 - 10.3 mg/dL   Total Protein 6.2 (L) 6.5 - 8.1 g/dL   Albumin 2.6 (L) 3.5 - 5.0 g/dL   AST 16 15 - 41 U/L   ALT 15 14 - 54 U/L   Alkaline Phosphatase 76 38 - 126 U/L   Total Bilirubin 1.1 0.3 - 1.2 mg/dL   GFR calc non Af Amer >60 >60 mL/min   GFR calc Af Amer >60 >60 mL/min   Anion gap 9 5 - 15  Amnisure rupture of membrane (rom)not at [facility]     Status: None   Collection Time: 07/17/15  5:21 AM  Result Value Ref Range   Amnisure ROM NEGATIVE    Imaging CT sprial CT negative for PE. MRI not optimal however i spoke with Dr. Pascal Lux (radiology) who reviewed the partial scan he states that gluteal abscess was not seen / appendix appears normal/ galbladder appears normal..  He does not think CT scan of the abdomen / pelvis will ad to what he could see from partial scan..  Assessment / Plan: 24 wks and 2 days with marginal abruption and fever of unknown origin / constipation/ hypothyroidism   Fetal Wellbeing:  mostly category 1 occasional category 2 with resolution  Pain Control:  oxycodone prn  I/D:  ID consult pending. Dr. Karolee Ohs to see pt this AM... she is aware of increase leukocytosis and gram negative Cocci in glood culture ... she add vancomycin to zosyn .. plan repeat CBC  with diff in AM... Lactic acid ordered  plan CMP in am  Per Dr. Wilder Glade will hold  on ordering GI consult until she evaluates the patient. ..  Hypothyroidism- continue levothyroxine follow TSH and free T4 Constipation - Miralax   Meghan Parsons J. 07/17/2015, 10:20 AM

## 2015-07-17 NOTE — Progress Notes (Signed)
Patient discharged via [facility] for transfer to [facility].

## 2015-07-17 NOTE — Progress Notes (Signed)
Meghan Parsons is a 40 y.o. G1P0 at [redacted]w[redacted]d by ultrasound admitted for marginal abruption/ Oligo hydramnios/ gluteal fold pain   Subjective: In to assess patient she denies abdominal pain reports that she feels a little better. Still has gluteal pain. In questioning the patient she reports that she had some leakage of fluid several weeks ago and was evaluate at [facility] and told that she was not ruptured. She has had 2 negative amniosures here.   Objective: BP 103/60 mmHg  Pulse 121  Temp(Src) 98.6 F (37 C) (Oral)  Resp 20  Ht 5\' 7"  (1.702 m)  Wt 265 lb 8 oz (120.43 kg)  BMI 41.57 kg/m2  SpO2 97%   Total I/O In: 1052.5 [P.O.:240; I.V.:300; IV Piggyback:512.5] Out: 1300 [Urine:1300] CV tachycardia regular rhythm Lungs clear Abdomen gravid nontender Ext slight edema no pitting edema  FHT:  FHR: baseline 150 with occasional variable decel to the 90's overal reassuring  bpm, variability: moderate,  accelerations:  Present,  decelerations:  Present variable UC:   none Sterile speculum exam performed. Small amount of fluid on perineum no gross pooling in the vaginal vault AMNIOSURE is collected.    Results for orders placed or performed during the hospital encounter of 07/15/15 (from the past 24 hour(s))  CBC with Differential/Platelet     Status: Abnormal   Collection Time: 07/16/15  6:05 PM  Result Value Ref Range   WBC 14.3 (H) 4.0 - 10.5 K/uL   RBC 3.90 3.87 - 5.11 MIL/uL   Hemoglobin 8.8 (L) 12.0 - 15.0 g/dL   HCT 27.2 (L) 36.0 - 46.0 %   MCV 69.7 (L) 78.0 - 100.0 fL   MCH 22.6 (L) 26.0 - 34.0 pg   MCHC 32.4 30.0 - 36.0 g/dL   RDW 14.8 11.5 - 15.5 %   Platelets 246 150 - 400 K/uL   Neutrophils Relative % 87 %   Lymphocytes Relative 12 %   Monocytes Relative 1 %   Eosinophils Relative 0 %   Basophils Relative 0 %   Band Neutrophils 0 %   Metamyelocytes Relative 0 %   Myelocytes 0 %   Promyelocytes Absolute 0 %   Blasts 0 %   nRBC 1 (H) 0 /100 WBC   Other 0 %   Neutro  Abs 12.5 (H) 1.7 - 7.7 K/uL   Lymphs Abs 1.7 0.7 - 4.0 K/uL   Monocytes Absolute 0.1 0.1 - 1.0 K/uL   Eosinophils Absolute 0.0 0.0 - 0.7 K/uL   Basophils Absolute 0.0 0.0 - 0.1 K/uL   RBC Morphology POLYCHROMASIA PRESENT   Culture, blood (Routine X 2) w Reflex to ID Panel     Status: None (Preliminary result)   Collection Time: 07/16/15  7:50 PM  Result Value Ref Range   Specimen Description BLOOD LEFT ARM    Special Requests BOTTLES DRAWN AEROBIC AND ANAEROBIC 10ML EA    Culture  Setup Time      GRAM POSITIVE COCCI IN CLUSTERS IN BOTH AEROBIC AND ANAEROBIC BOTTLES CRITICAL RESULT CALLED TO, READ BACK BY AND VERIFIED WITH: K HAYNES,RN AT [id number] 07/17/15 BY L BENFIELD Performed at [place]    Report Status PENDING   Culture, blood (Routine X 2) w Reflex to ID Panel     Status: None (Preliminary result)   Collection Time: 07/16/15  7:54 PM  Result Value Ref Range   Specimen Description BLOOD LEFT ARM    Special Requests BOTTLES  DRAWN AEROBIC AND ANAEROBIC 10ML EA    Culture  Setup Time      GRAM POSITIVE COCCI IN CLUSTERS AEROBIC BOTTLE ONLY CRITICAL RESULT CALLED TO, READ BACK BY AND VERIFIED WITH: K HAYNES,RN AT [id number] 07/17/15 BY L BENFIELD Performed at [place]    Report Status PENDING   CBC with Differential/Platelet     Status: Abnormal   Collection Time: 07/17/15  5:12 AM  Result Value Ref Range   WBC 20.9 (H) 4.0 - 10.5 K/uL   RBC 3.75 (L) 3.87 - 5.11 MIL/uL   Hemoglobin 8.3 (L) 12.0 - 15.0 g/dL   HCT 25.9 (L) 36.0 - 46.0 %   MCV 69.1 (L) 78.0 - 100.0 fL   MCH 22.1 (L) 26.0 - 34.0 pg   MCHC 32.0 30.0 - 36.0 g/dL   RDW 14.6 11.5 - 15.5 %   Platelets 223 150 - 400 K/uL   Neutrophils Relative % 87 %   Neutro Abs 18.2 (H) 1.7 - 7.7 K/uL   Lymphocytes Relative 9 %   Lymphs Abs 1.9 0.7 - 4.0 K/uL   Monocytes Relative 4 %   Monocytes Absolute 0.9 0.1 - 1.0 K/uL   Eosinophils Relative 0 %    Eosinophils Absolute 0.0 0.0 - 0.7 K/uL   Basophils Relative 0 %   Basophils Absolute 0.0 0.0 - 0.1 K/uL  T4, free     Status: None   Collection Time: 07/17/15  5:12 AM  Result Value Ref Range   Free T4 0.77 0.61 - 1.12 ng/dL  TSH     Status: None   Collection Time: 07/17/15  5:12 AM  Result Value Ref Range   TSH 3.267 0.350 - 4.500 uIU/mL  Comprehensive metabolic panel     Status: Abnormal   Collection Time: 07/17/15  5:12 AM  Result Value Ref Range   Sodium 134 (L) 135 - 145 mmol/L   Potassium 3.7 3.5 - 5.1 mmol/L   Chloride 104 101 - 111 mmol/L   CO2 21 (L) 22 - 32 mmol/L   Glucose, Bld 131 (H) 65 - 99 mg/dL   BUN 6 6 - 20 mg/dL   Creatinine, Ser 0.59 0.44 - 1.00 mg/dL   Calcium 8.3 (L) 8.9 - 10.3 mg/dL   Total Protein 6.2 (L) 6.5 - 8.1 g/dL   Albumin 2.6 (L) 3.5 - 5.0 g/dL   AST 16 15 - 41 U/L   ALT 15 14 - 54 U/L   Alkaline Phosphatase 76 38 - 126 U/L   Total Bilirubin 1.1 0.3 - 1.2 mg/dL   GFR calc non Af Amer >60 >60 mL/min   GFR calc Af Amer >60 >60 mL/min   Anion gap 9 5 - 15  Amnisure rupture of membrane (rom)not at [facility]     Status: None   Collection Time: 07/17/15  5:21 AM  Result Value Ref Range   Amnisure ROM NEGATIVE   Lactic acid, plasma     Status: None   Collection Time: 07/17/15  9:03 AM  Result Value Ref Range   Lactic Acid, Venous 0.9 0.5 - 2.0 mmol/L  Amnisure rupture of membrane (rom)not at [facility]     Status: None   Collection Time: 07/17/15  1:45 PM  Result Value Ref Range   Amnisure ROM NEGATIVE        Assessment / Plan: 24 wks and 2 days with fever of unknown origin / oligohydramnios/ gluteal pain/ hypothyroidism  AMniosure  was collected again and was negative (3). I am concerned that she has PROM however it can not be proven .Marland Kitchen Given her early gestational age I discussed the case with Dr. Renella Cunas. Given her fever of unknown origin/ bacteremia and  inability to confirm rupture recommend referral to [facility].. They will evaluate further  possible via dye test.  The patient and her husband are agreeable to this plan  Start Magnesium sulfate for started for neuroprophylaxis  Bacteremia - continue  zosyn and vancomycin per ID recommendation  I spoke with Dr. Terisa Starr with [facility] and he agrees to accept her transfer.  Aleesha Ringstad J. 07/17/2015, 2:24 PM

## 2015-07-17 NOTE — Progress Notes (Signed)
Called to Room for variable decelerations.     @0927   FT: 140 bpm, moderate variability, 10x10 accels present, variable deceleration x2 min nadir into 90's @0954   FT:  150 bpm,   Moderate variability, 10x10 present, variable deceleration x2 min with nadir into 70's  Dr. Landry Mellow consulted.     Hold diet order Continous monitoring.  Maternal IV bolus Reevaluate in an hour   Meghan Parsons, CNM 07/17/15  11:59

## 2015-07-17 NOTE — Progress Notes (Signed)
ANTIBIOTIC CONSULT NOTE - INITIAL  Pharmacy Consult for Vancomycin Indication: GPCs growing in blood  No Known Allergies  Patient Measurements: Height: 5\' 7"  (170.2 cm) Weight: 265 lb 8 oz (120.43 kg) IBW/kg (Calculated) : 61.6   Vital Signs: Temp: 98.6 F (37 C) (04/29 1410) Temp Source: Oral (04/29 1410) BP: 104/59 mmHg (04/29 1430) Pulse Rate: 109 (04/29 1430)  Labs:  Recent Labs  07/16/15 0134 07/16/15 1805 07/17/15 0512  WBC 16.1* 14.3* 20.9*  HGB 8.4* 8.8* 8.3*  PLT 231 246 223  CREATININE  --   --  0.59   No results for input(s): VANCOTROUGH, VANCOPEAK, VANCORANDOM, GENTTROUGH, GENTPEAK, GENTRANDOM in the last 72 hours.   Microbiology: Recent Results (from the past 720 hour(s))  OB Urine Culture     Status: Abnormal   Collection Time: 07/14/15 10:58 AM  Result Value Ref Range Status   Specimen Description URINE, CLEAN CATCH  Final   Special Requests NONE  Final   Culture (A)  Final    MULTIPLE SPECIES PRESENT, SUGGEST RECOLLECTION NO GROUP B STREP (S.AGALACTIAE) ISOLATED Performed at [facility]    Report Status 07/16/2015 FINAL  Final  Wet prep, genital     Status: Abnormal   Collection Time: 07/15/15 11:59 PM  Result Value Ref Range Status   Yeast Wet Prep HPF POC NONE SEEN NONE SEEN Final   Trich, Wet Prep NONE SEEN NONE SEEN Final   Clue Cells Wet Prep HPF POC NONE SEEN NONE SEEN Final   WBC, Wet Prep HPF POC MANY (A) NONE SEEN Final    Comment: MANY BACTERIA SEEN   Sperm NONE SEEN  Final  Group B strep by PCR     Status: None   Collection Time: 07/16/15  3:40 AM  Result Value Ref Range Status   Group B strep by PCR NEGATIVE NEGATIVE Final  Culture, blood (Routine X 2) w Reflex to ID Panel     Status: None (Preliminary result)   Collection Time: 07/16/15  7:50 PM  Result Value Ref Range Status   Specimen Description BLOOD LEFT ARM  Final   Special Requests BOTTLES DRAWN AEROBIC AND ANAEROBIC 10ML EA  Final   Culture  Setup Time    Final    GRAM POSITIVE COCCI IN CLUSTERS IN BOTH AEROBIC AND ANAEROBIC BOTTLES CRITICAL RESULT CALLED TO, READ BACK BY AND VERIFIED WITH: K HAYNES,RN AT [id number] 07/17/15 BY L BENFIELD Performed at [place]  Final   Report Status PENDING  Incomplete  Culture, blood (Routine X 2) w Reflex to ID Panel     Status: None (Preliminary result)   Collection Time: 07/16/15  7:54 PM  Result Value Ref Range Status   Specimen Description BLOOD LEFT ARM  Final   Special Requests BOTTLES DRAWN AEROBIC AND ANAEROBIC 10ML EA  Final   Culture  Setup Time   Final    GRAM POSITIVE COCCI IN CLUSTERS AEROBIC BOTTLE ONLY CRITICAL RESULT CALLED TO, READ BACK BY AND VERIFIED WITH: K HAYNES,RN AT [id number] 07/17/15 BY L BENFIELD Performed at [place] [facility]  Final   Report Status PENDING  Incomplete    Medications:  Piperacillin-tazobactam 3.375g Q8hr  Assessment: 40 y.o. female G1P0 at [redacted]w[redacted]d with fevers and gram positive cocci in the blood x2  Goal of Therapy:  Vancomycin Trough 15-20 mg/L  Plan:  Vancomycin 2500 mg IV x 1  Vancomycin 1250 mg  IV every 8 hrs  Will check vancomycin trough at steady-state around the 4th dose @1200  on 4/30 or when clinically indicated.  Nick Stults [place] Noe 07/17/2015,2:39 PM

## 2015-07-17 NOTE — Progress Notes (Signed)
CRITICAL VALUE ALERT  Critical value received:  Both sets of blood cultures grew Gram Positive Cocci  Date of notification: 07/17/15  Time of notification:  0823  Critical value read back:Yes.    Nurse who received alert:  Edmund Hilda, RN  MD notified (1st page):  Lavetta Nielsen, CNM  Spoke with on phone at [place]  Time of first page:0830  MD notified (2nd page):N/A  Time of second page:N/A  Responding MD:  Lavetta Nielsen, CNM  Time MD responded: [phone]

## 2015-07-17 NOTE — Consult Note (Addendum)
[facility]  Total days of antibiotics 2        Day 2 piptazo               Reason for Consult: fever of unknown origin in 62yoF G1P0 [redacted] wk gestation    Referring Physician: dillard  Active Problems:   Vaginal bleeding during pregnancy, antepartum    HPI: Meghan Parsons, originally from [place] is a 40 y.o. female G1P0 at 110.[redacted] week gestation, pregnancy via IVF which has been uncomplicated until roughly 3 weeks ago, when patient reports having drenching nightsweats, subjective fevers (did not record temp), and fatigue. She was first evaluated at [facility] on 4/26  For complaint of body aches, fever, fatigue x 2 weeks. She was afebrile at this visit. 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 Ruled out for uti and influenza. She was seen the following day on 4/27, wednesday for severe pain to gluteral fold/coccyx though still afebrile. Admitted due to worsening pain but now found to have temps up to 103.63F. She was started on piptazo given concern for intra-abdominal infection. She reported having constipation x 4 d on admit, no pelvic or abdominal pain but mostly coccyc pain. She reports that pain is worse when she sits down, or tries to lift/move leg. She also noted to be tachycardic and complained of intermittent chest wall pain. Chest CTA ruled out PE. She attempted to undergo MRI though unable to complete study due to pain. Prelim read of incomplete study does not suggest that she had rectal abscess. Her rectal exam was without discomfort. No rash appreciated to gluteal folds. Infectious work up included ua, urine cx, and blood cx. Her blood cx at  '@16hr'  shows + [facility] in 3/4 bottles. She is no longer febrile though still has tachycardia in 110s. BP WNL. Respiratory rate 20s.   No sick contacts. She works has home health aid. She did not get the flu vaccine this season. Has been exercising by walking outdoors in the last 4 wk but denies any  recent bug bites.  No recent travel. Last went to [place] in 2014.   seh states that she is feeling slightly better  She is receiving steroids due to fetal fibronectin +  ROS: left shoulder pain that she subscribes to sleeping on. As well as right knee pain. Constipation. Fever, chills, nightsweats.poor po intake. Otherwise 10 ponit ros is negative  Past Medical History  Diagnosis Date  . Hypothyroidism   . Anemia   . Hyperprolactinemia (HCC)     Allergies: No Known Allergies  MEDICATIONS: . docusate sodium  100 mg Oral BID  . levothyroxine  75 mcg Oral QAC breakfast  . piperacillin-tazobactam  3.375 g Intravenous Q8H  . polyethylene glycol  17 g Oral BID  . prenatal multivitamin  1 tablet Oral Q1200    Social History  Substance Use Topics  . Smoking status: Never Smoker   . Smokeless tobacco: Never Used  . Alcohol Use: No    Family History  Problem Relation Age of Onset  . Hypertension Father      OBJECTIVE: Temp:  [98 F (36.7 C)-103.1 F (39.5 C)] 98.3 F (36.8 C) (04/29 0815) Pulse Rate:  [103-139] 115 (04/29 1000) Resp:  [17-26] 22 (04/29 0900) BP: (92-136)/(42-78) 100/48 mmHg (04/29 1000) SpO2:  [91 %-100 %] 99 % (04/29 1000) Weight:  [265 lb 8 oz (120.43 kg)] 265 lb 8  oz (120.43 kg) (04/29 1004) Physical Exam  Constitutional:  oriented to person, place, and time. appears well-developed and well-nourished. No distress. Fatigue appearing.laying down on right lateral position HENT: [place]/AT, PERRLA, no scleral icterus Mouth/Throat: Oropharynx is clear and moist. No oropharyngeal exudate.  Cardiovascular: Normal rate, regular rhythm and normal heart sounds. Exam reveals no gallop and no friction rub.  No murmur heard.  Pulmonary/Chest: Effort normal and breath sounds normal. No respiratory distress.  has no wheezes.  Neck = supple, no nuchal rigidity Abdominal: Soft. Bowel sounds are normal.  exhibits no distension. There is no tenderness.  Lymphadenopathy:  no cervical adenopathy. No axillary adenopathy Neurological: alert and oriented to person, place, and time. Guarded due to pain with movement of legs  Skin: Skin is warm and dry. No rash noted to buttock. No erythema.  Msk = pain with external rotation of arm @ shoulder joint. No warmth, or swelling. Right knee is non tender, no swelling, no warmth Psychiatric: a normal mood and affect.  behavior is normal.   LABS: Results for orders placed or performed during the hospital encounter of 07/15/15 (from the past 48 hour(s))  Urine rapid drug screen (hosp performed)     Status: None   Collection Time: 07/15/15  9:38 PM  Result Value Ref Range   Opiates NONE DETECTED NONE DETECTED   Cocaine NONE DETECTED NONE DETECTED   Benzodiazepines NONE DETECTED NONE DETECTED   Amphetamines NONE DETECTED NONE DETECTED   Tetrahydrocannabinol NONE DETECTED NONE DETECTED   Barbiturates NONE DETECTED NONE DETECTED    Comment:        DRUG SCREEN FOR MEDICAL PURPOSES ONLY.  IF CONFIRMATION IS NEEDED FOR ANY PURPOSE, NOTIFY LAB WITHIN 5 DAYS.        LOWEST DETECTABLE LIMITS FOR URINE DRUG SCREEN Drug Class       Cutoff (ng/mL) Amphetamine      1000 Barbiturate      200 Benzodiazepine   038 Tricyclics       333 Opiates          300 Cocaine          300 THC              50   Wet prep, genital     Status: Abnormal   Collection Time: 07/15/15 11:59 PM  Result Value Ref Range   Yeast Wet Prep HPF POC NONE SEEN NONE SEEN   Trich, Wet Prep NONE SEEN NONE SEEN   Clue Cells Wet Prep HPF POC NONE SEEN NONE SEEN   WBC, Wet Prep HPF POC MANY (A) NONE SEEN    Comment: MANY BACTERIA SEEN   Sperm NONE SEEN   Fetal fibronectin     Status: Abnormal   Collection Time: 07/15/15 11:59 PM  Result Value Ref Range   Fetal Fibronectin POSITIVE (A) NEGATIVE  CBC with Differential/Platelet     Status: Abnormal   Collection Time: 07/16/15  1:34 AM  Result Value Ref Range   WBC 16.1 (H) 4.0 - 10.5 K/uL   RBC 3.75 (L)  3.87 - 5.11 MIL/uL   Hemoglobin 8.4 (L) 12.0 - 15.0 g/dL   HCT 26.0 (L) 36.0 - 46.0 %   MCV 69.3 (L) 78.0 - 100.0 fL   MCH 22.4 (L) 26.0 - 34.0 pg   MCHC 32.3 30.0 - 36.0 g/dL   RDW 14.8 11.5 - 15.5 %   Platelets 231 150 - 400 K/uL   Neutrophils Relative % 80 %  Lymphocytes Relative 13 %   Monocytes Relative 7 %   Eosinophils Relative 0 %   Basophils Relative 0 %   Other 0 %   Neutro Abs 12.9 (H) 1.7 - 7.7 K/uL   Lymphs Abs 2.1 0.7 - 4.0 K/uL   Monocytes Absolute 1.1 (H) 0.1 - 1.0 K/uL   Eosinophils Absolute 0.0 0.0 - 0.7 K/uL   Basophils Absolute 0.0 0.0 - 0.1 K/uL  Type and screen [facility]     Status: None   Collection Time: 07/16/15  1:34 AM  Result Value Ref Range   ABO/RH(D) O POS    Antibody Screen NEG    Sample Expiration 07/19/2015   ABO/Rh     Status: None   Collection Time: 07/16/15  1:34 AM  Result Value Ref Range   ABO/RH(D) O POS   Group B strep by PCR     Status: None   Collection Time: 07/16/15  3:40 AM  Result Value Ref Range   Group B strep by PCR NEGATIVE NEGATIVE  Amnisure rupture of membrane (rom)not at [facility]     Status: None   Collection Time: 07/16/15  3:40 AM  Result Value Ref Range   Amnisure ROM NEGATIVE   CBC with Differential/Platelet     Status: Abnormal   Collection Time: 07/16/15  6:05 PM  Result Value Ref Range   WBC 14.3 (H) 4.0 - 10.5 K/uL   RBC 3.90 3.87 - 5.11 MIL/uL   Hemoglobin 8.8 (L) 12.0 - 15.0 g/dL   HCT 27.2 (L) 36.0 - 46.0 %   MCV 69.7 (L) 78.0 - 100.0 fL    Comment: REPEATED TO VERIFY   MCH 22.6 (L) 26.0 - 34.0 pg   MCHC 32.4 30.0 - 36.0 g/dL   RDW 14.8 11.5 - 15.5 %   Platelets 246 150 - 400 K/uL   Neutrophils Relative % 87 %   Lymphocytes Relative 12 %   Monocytes Relative 1 %   Eosinophils Relative 0 %   Basophils Relative 0 %   Band Neutrophils 0 %   Metamyelocytes Relative 0 %   Myelocytes 0 %   Promyelocytes Absolute 0 %   Blasts 0 %   nRBC 1 (H) 0 /100 WBC   Other 0 %   Neutro Abs  12.5 (H) 1.7 - 7.7 K/uL   Lymphs Abs 1.7 0.7 - 4.0 K/uL   Monocytes Absolute 0.1 0.1 - 1.0 K/uL   Eosinophils Absolute 0.0 0.0 - 0.7 K/uL   Basophils Absolute 0.0 0.0 - 0.1 K/uL   RBC Morphology POLYCHROMASIA PRESENT   Culture, blood (Routine X 2) w Reflex to ID Panel     Status: None (Preliminary result)   Collection Time: 07/16/15  7:50 PM  Result Value Ref Range   Specimen Description BLOOD LEFT ARM    Special Requests BOTTLES DRAWN AEROBIC AND ANAEROBIC 10ML EA    Culture  Setup Time      GRAM POSITIVE COCCI IN CLUSTERS IN BOTH AEROBIC AND ANAEROBIC BOTTLES CRITICAL RESULT CALLED TO, READ BACK BY AND VERIFIED WITH: K HAYNES,RN AT 5621 07/17/15 BY L BENFIELD Performed at [facility]    Report Status PENDING   Culture, blood (Routine X 2) w Reflex to ID Panel     Status: None (Preliminary result)   Collection Time: 07/16/15  7:54 PM  Result Value Ref Range   Specimen Description BLOOD LEFT ARM    Special  Requests BOTTLES DRAWN AEROBIC AND ANAEROBIC 10ML EA    Culture  Setup Time      GRAM POSITIVE COCCI IN CLUSTERS AEROBIC BOTTLE ONLY CRITICAL RESULT CALLED TO, READ BACK BY AND VERIFIED WITH: K HAYNES,RN AT [id number] 07/17/15 BY L BENFIELD Performed at [facility]    Report Status PENDING   CBC with Differential/Platelet     Status: Abnormal   Collection Time: 07/17/15  5:12 AM  Result Value Ref Range   WBC 20.9 (H) 4.0 - 10.5 K/uL   RBC 3.75 (L) 3.87 - 5.11 MIL/uL   Hemoglobin 8.3 (L) 12.0 - 15.0 g/dL   HCT 25.9 (L) 36.0 - 46.0 %   MCV 69.1 (L) 78.0 - 100.0 fL   MCH 22.1 (L) 26.0 - 34.0 pg   MCHC 32.0 30.0 - 36.0 g/dL   RDW 14.6 11.5 - 15.5 %   Platelets 223 150 - 400 K/uL   Neutrophils Relative % 87 %   Neutro Abs 18.2 (H) 1.7 - 7.7 K/uL   Lymphocytes Relative 9 %   Lymphs Abs 1.9 0.7 - 4.0 K/uL   Monocytes Relative 4 %   Monocytes Absolute 0.9 0.1 - 1.0 K/uL   Eosinophils Relative 0 %    Eosinophils Absolute 0.0 0.0 - 0.7 K/uL   Basophils Relative 0 %   Basophils Absolute 0.0 0.0 - 0.1 K/uL  Comprehensive metabolic panel     Status: Abnormal   Collection Time: 07/17/15  5:12 AM  Result Value Ref Range   Sodium 134 (L) 135 - 145 mmol/L   Potassium 3.7 3.5 - 5.1 mmol/L   Chloride 104 101 - 111 mmol/L   CO2 21 (L) 22 - 32 mmol/L   Glucose, Bld 131 (H) 65 - 99 mg/dL   BUN 6 6 - 20 mg/dL   Creatinine, Ser 0.59 0.44 - 1.00 mg/dL   Calcium 8.3 (L) 8.9 - 10.3 mg/dL   Total Protein 6.2 (L) 6.5 - 8.1 g/dL   Albumin 2.6 (L) 3.5 - 5.0 g/dL   AST 16 15 - 41 U/L   ALT 15 14 - 54 U/L   Alkaline Phosphatase 76 38 - 126 U/L   Total Bilirubin 1.1 0.3 - 1.2 mg/dL   GFR calc non Af Amer >60 >60 mL/min   GFR calc Af Amer >60 >60 mL/min    Comment: (NOTE) The eGFR has been calculated using the CKD EPI equation. This calculation has not been validated in all clinical situations. eGFR's persistently <60 mL/min signify possible Chronic Kidney Disease.    Anion gap 9 5 - 15  Amnisure rupture of membrane (rom)not at [facility]     Status: None   Collection Time: 07/17/15  5:21 AM  Result Value Ref Range   Amnisure ROM NEGATIVE     MICRO: 4/28 blood cx 3/4 bottles [facility] 4/28 urine cx pending ua clean  LA = WNL IMAGING: Ct Angio Chest Pe W/cm &/or Wo Cm  07/17/2015  CLINICAL DATA:  Acute onset of fever of unknown origin. Lethargy and new onset of tachypnea. Marginal abruption, with gluteal pain. Initial encounter. EXAM: CT ANGIOGRAPHY CHEST WITH CONTRAST TECHNIQUE: Multidetector CT imaging of the chest was performed using the standard protocol during bolus administration of intravenous contrast. Multiplanar CT image reconstructions and MIPs were obtained to evaluate the vascular anatomy. CONTRAST:  100 mL of Isovue 370 IV contrast COMPARISON:  None. FINDINGS: There is no evidence of central pulmonary embolus. Evaluation  for pulmonary embolus is suboptimal due to limitations in the timing of  the contrast bolus and motion artifact. Mild bibasilar airspace opacities likely reflect atelectasis, though pneumonia could conceivably have a similar appearance. There is no evidence of pleural effusion or pneumothorax. No masses are identified; no abnormal focal contrast enhancement is seen. The mediastinum is grossly unremarkable in appearance. No mediastinal lymphadenopathy is seen. No pericardial effusion is identified. The great vessels are grossly unremarkable in appearance. No axillary lymphadenopathy is seen. The visualized portions of the thyroid gland are unremarkable in appearance. The visualized portions of the liver and spleen are unremarkable. The visualized portions of the pancreas, gallbladder, stomach, adrenal glands and kidneys are within normal limits. No acute osseous abnormalities are seen. Review of the MIP images confirms the above findings. IMPRESSION: 1. No evidence of central pulmonary embolus. Evaluation for pulmonary embolus is suboptimal due to limitations in the timing of the contrast bolus and motion artifact. 2. Mild bibasilar airspace opacities likely reflect atelectasis, though pneumonia could conceivably have a similar appearance, depending on the patient's symptoms. Electronically Signed   By: Garald Balding M.D.   On: 07/17/2015 01:21   [place] Mfm Ob Limited  07/16/2015  OBSTETRICAL ULTRASOUND: This exam was performed within a [facility]. The OB US report was generated in the AS system, and faxed to the ordering physician.  This report is available in the [employer]. See the [facility] Obstetric US report via the Image Link.  Assessment/Plan:  40yo F with G1P0 at [redacted] wk gestation presents with 3 week hx of subjective fevers, fatigue and nightsweats. She is admitted for fever of 103F, leukocytosis and acute gluteal/coccygeal pain. Started on empiric abtx. Infectious work up showing gram positive bacteremia.  Her picture is somewhat perplexing in that i don't  think that her acute onset of gluteal/coccygeal pain is related to 3 week history of drenching nightsweats/malaise/subjective fevers. Her lab work show leukocytosis of left shift but no thrombocytopenia or transaminitis that you might see with tick borne illnesses. Preliminary/ incomplete imaging by mri of pelvis did not suggest any pelvic or rectal abscess  Left shoulder pain could be due to a compression nerve palsy vs. Risk of early septic arthritis (seeded by gram positive bacteremia)  For now, hold the course until further information returns  Gram positive bacteremia=  - continue on vancomycin (for MRSA coverage), and piptazo  - await blood cx results.   - coccydynia = appears slightly improved. Continue to monitor. i think that preliminary imaging and physical exam do not suggest rectal abscess for now. Continue to monitor. Hold off on gi consultation for now.  - left shoulder pain = if worsens, would recommend imaging to see if there is an effusion that needs to be drained  - leukocytosis = could also be slightly increased by steroids. Repeat cbc with diff in the am  - fever of unknown origin = will check sed rate, crp, cmv and ebv. Will repeat blood cx tomorrow  Meghan Parsons for Infectious Diseases [phone]

## 2015-07-18 LAB — EPSTEIN-BARR VIRUS VCA ANTIBODY PANEL
EBV Early Antigen Ab, IgG: <9 U/mL (ref 0.0–8.9)
EBV VCA IGG: 417 U/mL — AB (ref 0.0–17.9)

## 2015-07-19 LAB — CULTURE, BLOOD (ROUTINE X 2)

## 2015-07-19 LAB — URINE CULTURE

## 2015-07-19 LAB — GC/CHLAMYDIA PROBE AMP (~~LOC~~) NOT AT ARMC
Chlamydia: NEGATIVE
NEISSERIA GONORRHEA: NEGATIVE

## 2015-07-20 LAB — CMV DNA, QUANTITATIVE, PCR
CMV DNA QUANT: NEGATIVE [IU]/mL
LOG10 CMV QN DNA PL: UNDETERMINED {Log_IU}/mL

## 2015-08-05 NOTE — Discharge Summary (Signed)
Physician Discharge Summary  Patient ID: Meghan Parsons MRN: [id number] DOB/AGE: 39-24-1977 39 y.o.  Admit date: 07/15/2015 Discharge date: 08/05/2015  Admission Diagnoses: 1) 24 wks and 1 day EGA 2) placental abruption 3) gluteal fold pain   Discharge Diagnoses: 2) 24wks and 2 days EGA 2)placental abruption 3) gluteal fold pain 4) fever of unknown origin 5) bacteremia   Active Problems:   Vaginal bleeding during pregnancy, antepartum   Discharged Condition: stable  Hospital Course: Pt was admitted on 07/16/2015 with placental abruption. She developed a fever of unknown origin with temp as high as 103. She had negative amniosure. . Blood cultures showed bacteremia. Infectious disease consult was obtained. She was started on zosyn. She continued to be febrile. She was transferred to [facility] where she could be managed by Obstetrical team with Maternal fetal medicine specialist.   Consults: ID  Significant Diagnostic Studies: Incomplete MRI peformed. Pt could not lie still to complete the study .Marland Kitchen Negative Amniosure x 3  Treatments: antibiotics: vancomycin and Zosyn    Disposition: 02-Transferred to [facility]     Medication List    ASK your doctor about these medications        acetaminophen 500 MG tablet  Commonly known as:  TYLENOL  Take 1,000 mg by mouth every 6 (six) hours as needed for mild pain or moderate pain.     ferrous sulfate 325 (65 FE) MG tablet  Take 325 mg by mouth at bedtime.     ibuprofen 200 MG tablet  Commonly known as:  ADVIL,MOTRIN  Take 400 mg by mouth every 6 (six) hours as needed for mild pain.     prenatal multivitamin Tabs tablet  Take 1 tablet by mouth at bedtime.         SignedCatha Brow. 08/05/2015, 9:13 AM

## 2016-05-18 ENCOUNTER — Encounter (HOSPITAL_COMMUNITY): Payer: Self-pay

## 2019-02-21 ENCOUNTER — Other Ambulatory Visit: Payer: Self-pay | Admitting: Internal Medicine

## 2019-02-21 DIAGNOSIS — Z1231 Encounter for screening mammogram for malignant neoplasm of breast: Secondary | ICD-10-CM

## 2019-04-14 ENCOUNTER — Ambulatory Visit: Payer: Managed Care, Other (non HMO)

## 2019-10-14 ENCOUNTER — Ambulatory Visit
Admission: RE | Admit: 2019-10-14 | Discharge: 2019-10-14 | Disposition: A | Discharge status: Home / Self Care | Payer: 59 | Source: Ambulatory Visit | Attending: Internal Medicine | Admitting: Internal Medicine

## 2019-10-14 ENCOUNTER — Other Ambulatory Visit: Payer: Self-pay

## 2019-10-14 DIAGNOSIS — Z1231 Encounter for screening mammogram for malignant neoplasm of breast: Secondary | ICD-10-CM

## 2019-12-05 ENCOUNTER — Ambulatory Visit: Payer: 59 | Admitting: Podiatry

## 2019-12-12 ENCOUNTER — Ambulatory Visit: Payer: 59 | Admitting: Podiatry

## 2019-12-18 ENCOUNTER — Ambulatory Visit: Payer: 59 | Admitting: Podiatry

## 2020-09-22 ENCOUNTER — Other Ambulatory Visit: Payer: Self-pay | Admitting: Internal Medicine

## 2020-09-22 DIAGNOSIS — Z1231 Encounter for screening mammogram for malignant neoplasm of breast: Secondary | ICD-10-CM

## 2020-11-29 ENCOUNTER — Ambulatory Visit
Admission: RE | Admit: 2020-11-29 | Discharge: 2020-11-29 | Disposition: A | Discharge status: Home / Self Care | Payer: 59 | Source: Ambulatory Visit | Attending: Internal Medicine | Admitting: Internal Medicine

## 2020-11-29 ENCOUNTER — Other Ambulatory Visit: Payer: Self-pay

## 2020-11-29 DIAGNOSIS — Z1231 Encounter for screening mammogram for malignant neoplasm of breast: Secondary | ICD-10-CM

## 2021-12-02 ENCOUNTER — Other Ambulatory Visit: Payer: Self-pay | Admitting: Internal Medicine

## 2021-12-03 LAB — COMPLETE METABOLIC PANEL WITH GFR
AG Ratio: 1.6 (calc) (ref 1.0–2.5)
ALT: 9 U/L (ref 6–29)
AST: 12 U/L (ref 10–30)
Albumin: 4.1 g/dL (ref 3.6–5.1)
Alkaline phosphatase (APISO): 33 U/L (ref 31–125)
BUN: 13 mg/dL (ref 7–25)
CO2: 24 mmol/L (ref 20–32)
Calcium: 9.5 mg/dL (ref 8.6–10.2)
Chloride: 103 mmol/L (ref 98–110)
Creat: 0.65 mg/dL (ref 0.50–0.99)
Globulin: 2.6 g/dL (calc) (ref 1.9–3.7)
Glucose, Bld: 109 mg/dL — ABNORMAL HIGH (ref 65–99)
Potassium: 4.4 mmol/L (ref 3.5–5.3)
Sodium: 138 mmol/L (ref 135–146)
Total Bilirubin: 0.5 mg/dL (ref 0.2–1.2)
Total Protein: 6.7 g/dL (ref 6.1–8.1)
eGFR: 111 mL/min/{1.73_m2} (ref >=60)

## 2021-12-03 LAB — CBC
HCT: 34.9 % — ABNORMAL LOW (ref 35.0–45.0)
Hemoglobin: 10.6 g/dL — ABNORMAL LOW (ref 11.7–15.5)
MCH: 22.3 pg — ABNORMAL LOW (ref 27.0–33.0)
MCHC: 30.4 g/dL — ABNORMAL LOW (ref 32.0–36.0)
MCV: 73.3 fL — ABNORMAL LOW (ref 80.0–100.0)
MPV: 10.2 fL (ref 7.5–12.5)
Platelets: 293 10*3/uL (ref 140–400)
RBC: 4.76 10*6/uL (ref 3.80–5.10)
RDW: 14.5 % (ref 11.0–15.0)
WBC: 4.9 10*3/uL (ref 3.8–10.8)

## 2021-12-03 LAB — LIPID PANEL
Cholesterol: 173 mg/dL (ref <200)
HDL: 90 mg/dL (ref >=50)
LDL Cholesterol (Calc): 69 mg/dL (calc)
Non-HDL Cholesterol (Calc): 83 mg/dL (calc) (ref <130)
Total CHOL/HDL Ratio: 1.9 (calc) (ref <5.0)
Triglycerides: 55 mg/dL (ref <150)

## 2021-12-03 LAB — VITAMIN D 25 HYDROXY (VIT D DEFICIENCY, FRACTURES): Vit D, 25-Hydroxy: 21 ng/mL — ABNORMAL LOW (ref 30–100)

## 2021-12-03 LAB — TSH: TSH: 2 mIU/L

## 2022-02-27 ENCOUNTER — Encounter (HOSPITAL_COMMUNITY): Payer: Self-pay

## 2022-02-27 ENCOUNTER — Other Ambulatory Visit: Payer: Self-pay

## 2022-02-27 ENCOUNTER — Emergency Department (HOSPITAL_COMMUNITY)
Admission: EM | Admit: 2022-02-27 | Discharge: 2022-02-27 | Disposition: A | Discharge status: Home / Self Care | Payer: Commercial Managed Care - HMO | Attending: Emergency Medicine | Admitting: Emergency Medicine

## 2022-02-27 DIAGNOSIS — X58XXXA Exposure to other specified factors, initial encounter: Secondary | ICD-10-CM | POA: Diagnosis not present

## 2022-02-27 DIAGNOSIS — M542 Cervicalgia: Secondary | ICD-10-CM | POA: Diagnosis present

## 2022-02-27 DIAGNOSIS — S161XXA Strain of muscle, fascia and tendon at neck level, initial encounter: Secondary | ICD-10-CM | POA: Insufficient documentation

## 2022-02-27 MED ORDER — CYCLOBENZAPRINE HCL 10 MG PO TABS: 10.0000 mg | ORAL_TABLET | Freq: Two times a day (BID) | ORAL | 0 refills | Status: AC | PRN

## 2022-02-27 NOTE — ED Triage Notes (Signed)
PT arrived POV from home c/o waking up 2 weeks ago with a pain in the neck and now it is hard for her to turn her neck and when she does she has a pain in her neck and the back of her head.

## 2022-02-27 NOTE — ED Provider Triage Note (Signed)
Emergency Medicine Provider Triage Evaluation Note  Meghan Parsons , a 46 y.o. female  was evaluated in triage.  Pt complains of right-sided neck pain over the last 2 weeks.  Initially woke up with the pain, believes she may have slept on it wrong.  Has tried Aleve, which provides temporary relief, however the pain returns.  Denies headache, vision changes, fever, midline neck pain, recent direct injury, numbness or tingling of the extremities, weakness, presyncopal sensation, dizziness, lightheadedness, chest pain, or shortness of breath.  Review of Systems  Positive:  Negative: See above  Physical Exam  BP (!) 148/53 (BP Location: Right Arm)   Pulse 73   Temp 98.5 F (36.9 C)   Resp 15   Ht '5\' 7"'$  (1.702 m)   Wt 117.9 kg   SpO2 100%   BMI 40.72 kg/m  Gen:   Awake, no distress  Resp:  Normal effort  MSK:   Moves extremities without difficulty  Other:  No meningismus.  No midline C-spine tenderness.  Mild difficulty laterally rotating head to the right.  Gaze aligned appropriately.  Tenderness in the right trapezius muscular region, with apparent radiation up towards the temporal/occipital region.  Medical Decision Making  Medically screening exam initiated at 12:02 PM.  Appropriate orders placed.  Meghan Parsons was informed that the remainder of the evaluation will be completed by another provider, this initial triage assessment does not replace that evaluation, and the importance of remaining in the ED until their evaluation is complete.     Prince Rome, PA-C 44/92/01 1204

## 2022-02-27 NOTE — Discharge Instructions (Addendum)
Return for any problem.   Use Aleve as instructed.  Apply ice and or heat as instructed.  Use muscle relaxant as prescribed as necessary.

## 2022-02-27 NOTE — ED Provider Notes (Signed)
[facility] EMERGENCY DEPARTMENT Provider Note   CSN: [id number] Arrival date & time: 02/27/22  1040     History  Chief Complaint  Patient presents with   Neck Pain    Meghan Parsons is a 46 y.o. female.  46 year old female with prior medical history as detailed below presents for evaluation.  Patient complains of right-sided neck and shoulder pain.  This is been ongoing for the last 2 weeks.  Patient reports that she does lots of heavy lifting at work.  She feels that she lifted something wrong.  Patient reports that the pain is intermittently improved with Aleve.  Patient reports increased muscle tightness and spasm in the evening and overnight when she is trying to sleep.  Denies visual change, nausea, vomiting, headache, chest pain, shortness of breath, or other complaint.  The history is provided by the patient and medical records.       Home Medications Prior to Admission medications   Medication Sig Start Date End Date Taking? Authorizing Provider  acetaminophen (TYLENOL) 500 MG tablet Take 1,000 mg by mouth every 6 (six) hours as needed for mild pain or moderate pain.    [provider]  ferrous sulfate 325 (65 FE) MG tablet Take 325 mg by mouth at bedtime.    [provider]  ibuprofen (ADVIL,MOTRIN) 200 MG tablet Take 400 mg by mouth every 6 (six) hours as needed for mild pain.    [provider]  Prenatal Vit-Fe Fumarate-FA (PRENATAL MULTIVITAMIN) TABS tablet Take 1 tablet by mouth at bedtime.    [provider]      Allergies    Patient has no known allergies.    Review of Systems   Review of Systems  All other systems reviewed and are negative.   Physical Exam Updated Vital Signs BP (!) 148/53 (BP Location: Right Arm)   Pulse 73   Temp 98.5 F (36.9 C)   Resp 15   Ht '5\' 7"'$  (1.702 m)   Wt 117.9 kg   SpO2 100%   BMI 40.72 kg/m  Physical Exam Vitals and nursing note reviewed.  Constitutional:       General: She is not in acute distress.    Appearance: Normal appearance. She is well-developed.  HENT:     Head: Normocephalic and atraumatic.  Eyes:     Conjunctiva/sclera: Conjunctivae normal.     Pupils: Pupils are equal, round, and reactive to light.  Neck:     Comments: Mild tenderness with palpation along the right lateral neck posteriorly.  No midline tenderness or step-off.  No overlying rash, erythema, ecchymosis. Cardiovascular:     Rate and Rhythm: Normal rate and regular rhythm.     Heart sounds: Normal heart sounds.  Pulmonary:     Effort: Pulmonary effort is normal. No respiratory distress.     Breath sounds: Normal breath sounds.  Abdominal:     General: There is no distension.     Palpations: Abdomen is soft.     Tenderness: There is no abdominal tenderness.  Musculoskeletal:        General: No deformity. Normal range of motion.     Cervical back: Normal range of motion and neck supple.  Skin:    General: Skin is warm and dry.  Neurological:     General: No focal deficit present.     Mental Status: She is alert and oriented to person, place, and time.     ED Results / Procedures /  Treatments   Labs (all labs ordered are listed, but only abnormal results are displayed) Labs Reviewed - No data to display  EKG None  Radiology No results found.  Procedures Procedures    Medications Ordered in ED Medications - No data to display  ED Course/ Medical Decision Making/ A&P                           Medical Decision Making Risk Prescription drug management.    Medical Screen Complete  This patient presented to the ED with complaint of neck pain.  This complaint involves an extensive number of treatment options. The initial differential diagnosis includes, but is not limited to, cervical strain  This presentation is: Acute, Self-Limited, Previously Undiagnosed, Uncertain Prognosis, Complicated, Systemic Symptoms, and Threat to Life/Bodily  Function  Patient is presenting with right-sided neck pain.  Patient's describe symptoms are most consistent with likely muscular strain of the cervical spine.  Patient without findings suggestive of significant radiculopathy.  Specifically there is no radiation of the pain into the right upper extremity and or muscle wasting of the right upper extremity.  Patient reassured by exam and discussion of treatment for cervical strain.  Patient understands need for continued use of Aleve.  She is willing to try muscle relaxant.  Patient does understand need for close outpatient follow-up with her PCP.  Strict return precautions given and understood.   Additional history obtained:  External records from outside sources obtained and reviewed including prior ED visits and prior Inpatient records.   Problem List / ED Course:  Cervical strain   Reevaluation:  After the interventions noted above, I reevaluated the patient and found that they have: stayed the same Disposition:  After consideration of the diagnostic results and the patients response to treatment, I feel that the patent would benefit from close outpatient follow-up.          Final Clinical Impression(s) / ED Diagnoses Final diagnoses:  Acute strain of neck muscle, initial encounter    Rx / DC Orders ED Discharge Orders     None         Valarie Merino, MD 02/27/22 1342

## 2022-05-08 ENCOUNTER — Other Ambulatory Visit: Payer: Self-pay | Admitting: Internal Medicine

## 2022-05-08 DIAGNOSIS — Z1231 Encounter for screening mammogram for malignant neoplasm of breast: Secondary | ICD-10-CM

## 2022-06-21 ENCOUNTER — Ambulatory Visit
Admission: RE | Admit: 2022-06-21 | Discharge: 2022-06-21 | Disposition: A | Discharge status: Home / Self Care | Payer: Medicaid Other | Source: Ambulatory Visit | Attending: Internal Medicine | Admitting: Internal Medicine

## 2022-06-21 DIAGNOSIS — Z1231 Encounter for screening mammogram for malignant neoplasm of breast: Secondary | ICD-10-CM

## 2023-04-30 IMAGING — MG MM DIGITAL SCREENING BILAT W/ TOMO AND CAD
6 of 12 series · 6 of 36 positions shown · non-contrast
Comparison: Previous exam(s).

CLINICAL DATA: Screening.

EXAM:
DIGITAL SCREENING BILATERAL MAMMOGRAM WITH TOMOSYNTHESIS AND CAD
TECHNIQUE: Bilateral screening digital craniocaudal and mediolateral oblique
mammograms were obtained. Bilateral screening digital breast
tomosynthesis was performed. The images were evaluated with
computer-aided detection.

[L MLO synth-2D (1 of 2)]
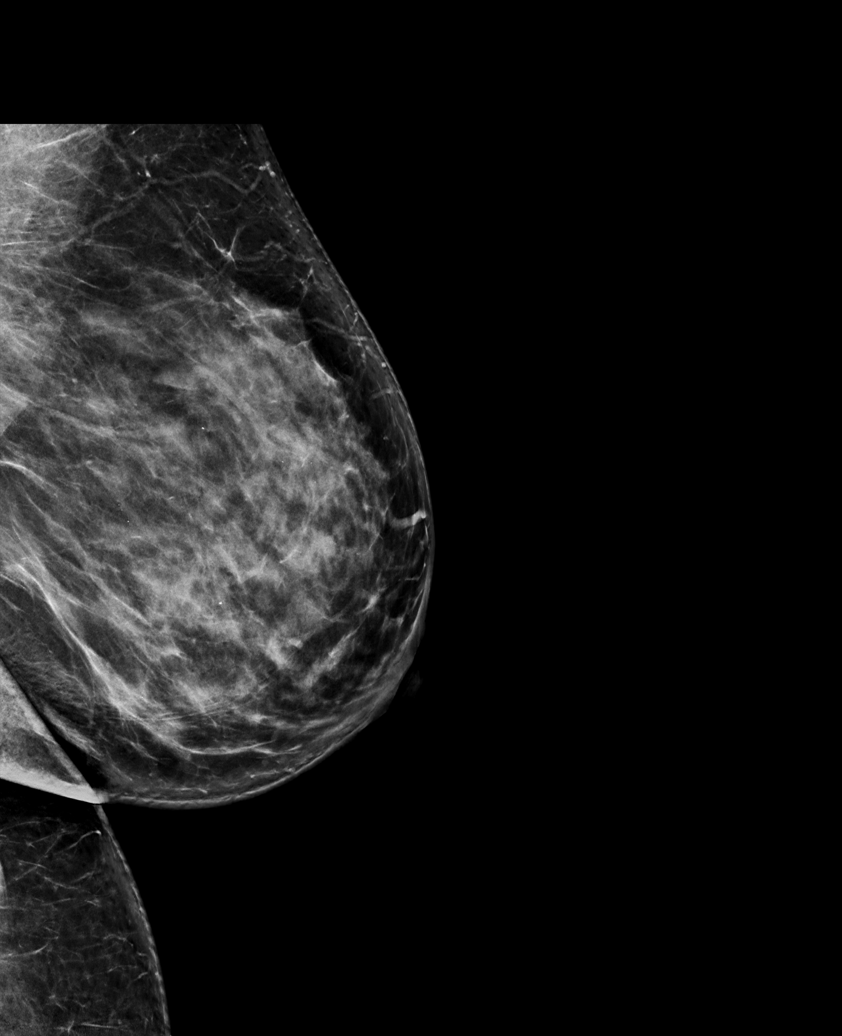

[L CC synth-2D]
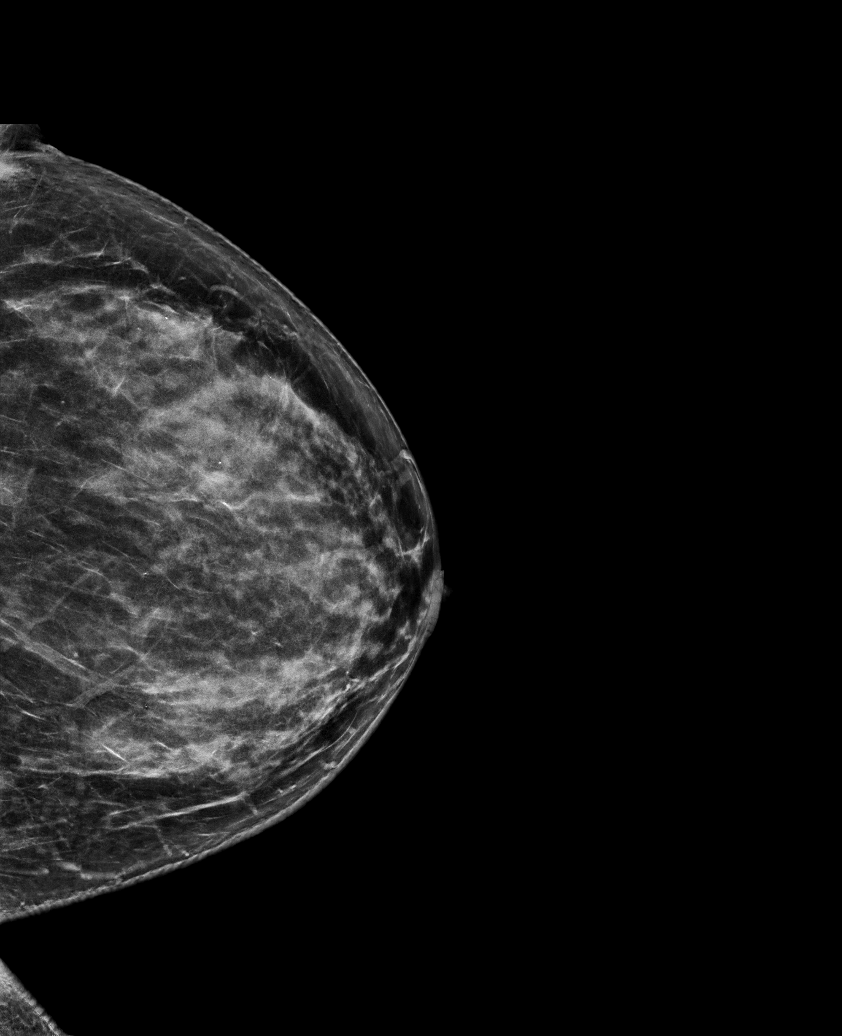

[R CC synth-2D]
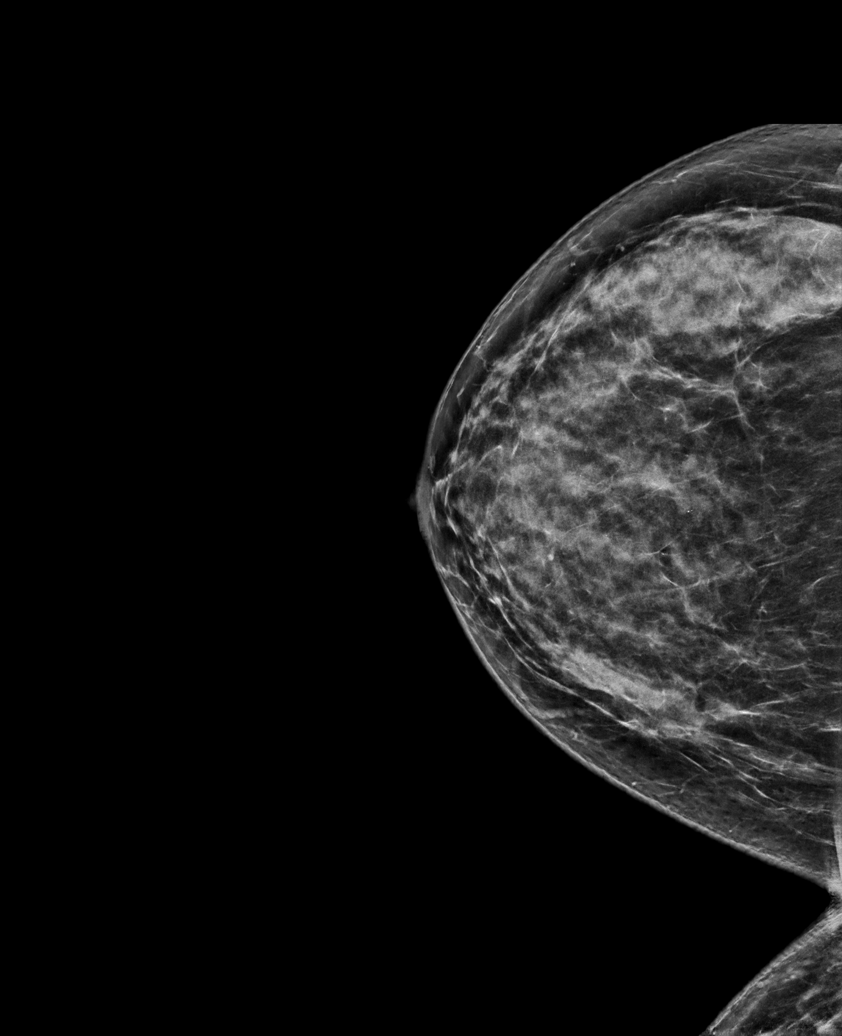

[R MLO synth-2D (1 of 2)]
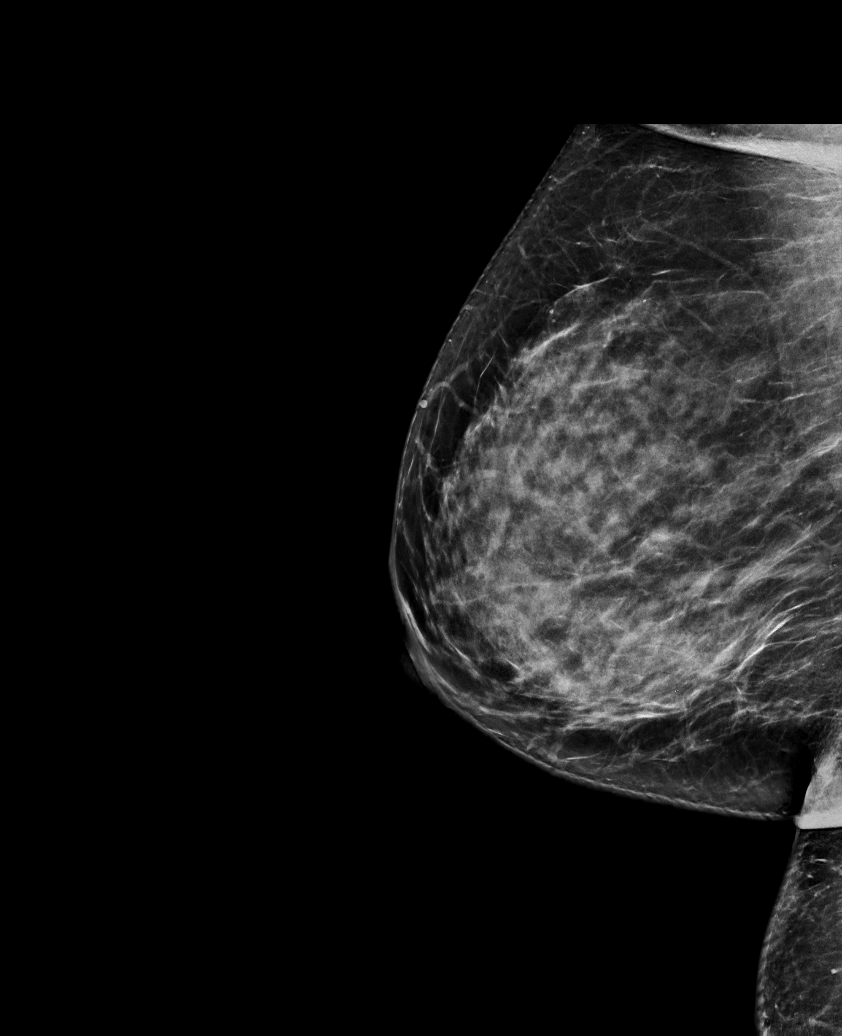

[L MLO synth-2D (2 of 2)]
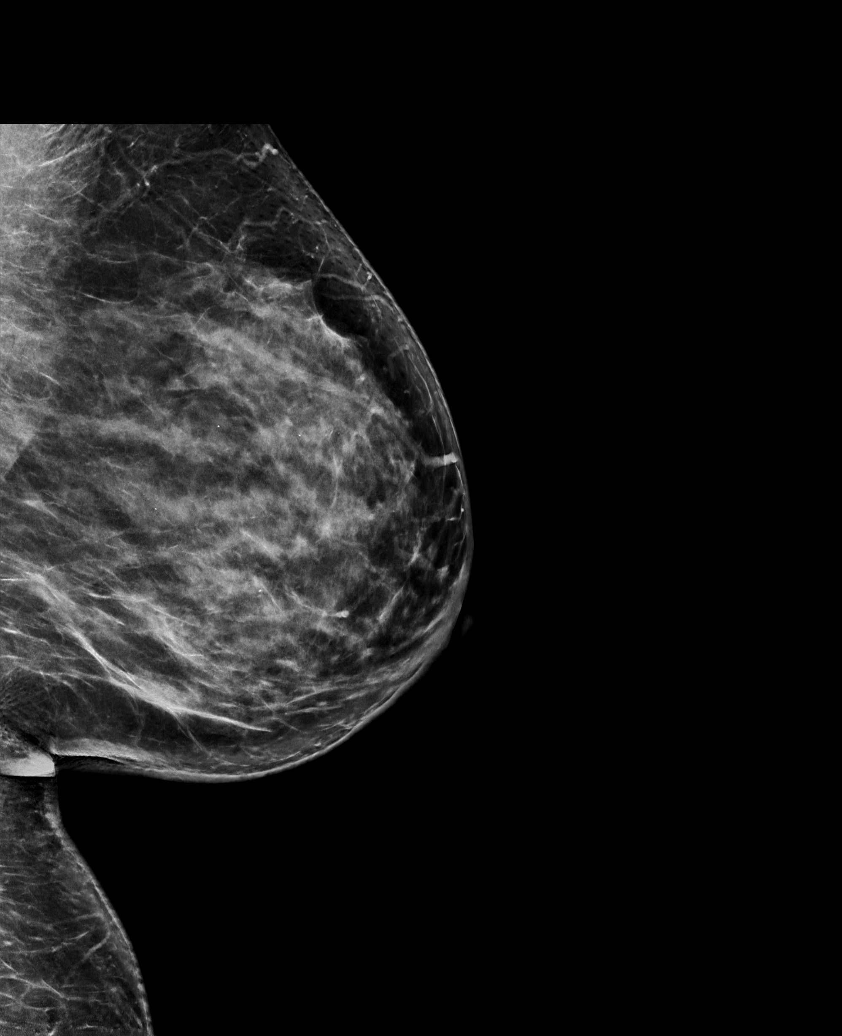

[R MLO synth-2D (2 of 2)]
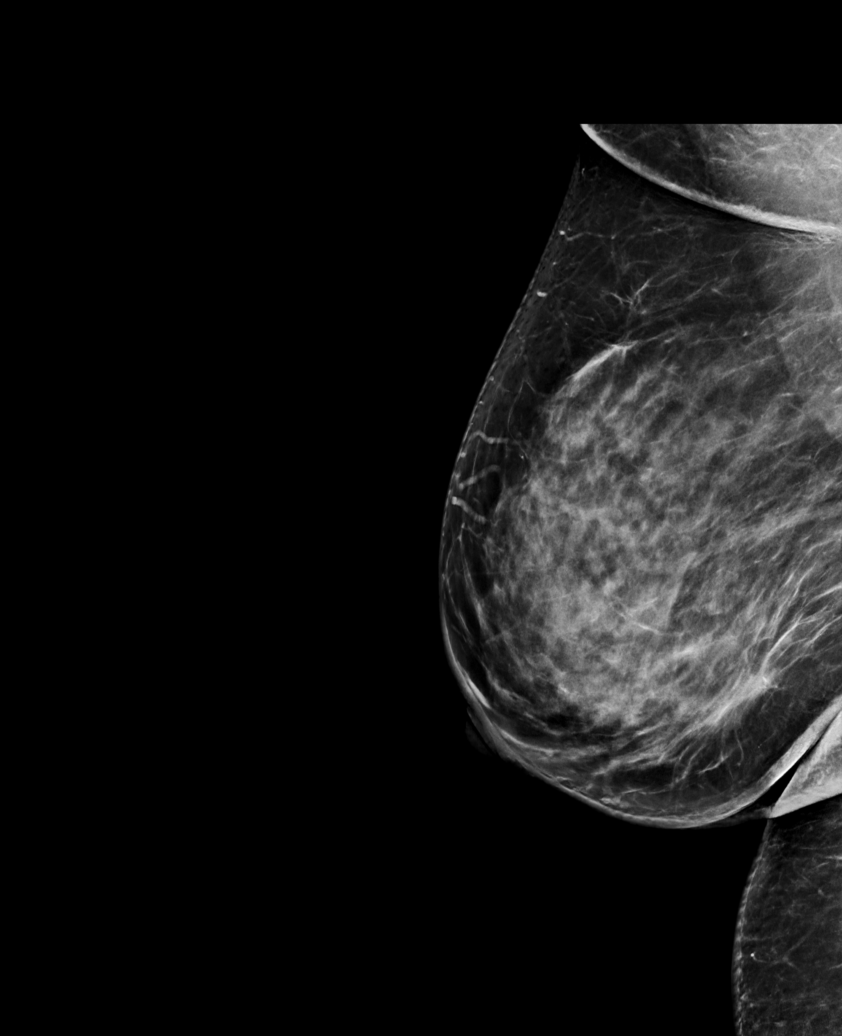

[6 of 36 positions shown; findings below may reference images not displayed]

ACR Breast Density Category d: The breast tissue is extremely dense,
which lowers the sensitivity of mammography
FINDINGS: There are no findings suspicious for malignancy.
IMPRESSION: No mammographic evidence of malignancy. A result letter of this
screening mammogram will be mailed directly to the patient.

RECOMMENDATION:
Screening mammogram in one year. (Code:[id number])

BI-RADS CATEGORY  1: Negative.
